# Patient Record
Sex: Male | Born: 1965 | Race: White | Hispanic: No | Marital: Married | Smoking: Never smoker
Health system: Southern US, Community
[De-identification: ages and names within clinical notes are randomized; demographics above are authoritative.]

## PROBLEM LIST (undated history)

## (undated) DIAGNOSIS — E785 Hyperlipidemia, unspecified: Secondary | ICD-10-CM

## (undated) DIAGNOSIS — I2510 Atherosclerotic heart disease of native coronary artery without angina pectoris: Secondary | ICD-10-CM

## (undated) DIAGNOSIS — W3400XA Accidental discharge from unspecified firearms or gun, initial encounter: Secondary | ICD-10-CM

## (undated) DIAGNOSIS — Z951 Presence of aortocoronary bypass graft: Secondary | ICD-10-CM

## (undated) DIAGNOSIS — G4733 Obstructive sleep apnea (adult) (pediatric): Secondary | ICD-10-CM

## (undated) DIAGNOSIS — Z9289 Personal history of other medical treatment: Secondary | ICD-10-CM

## (undated) DIAGNOSIS — I10 Essential (primary) hypertension: Secondary | ICD-10-CM

## (undated) DIAGNOSIS — M549 Dorsalgia, unspecified: Secondary | ICD-10-CM

## (undated) DIAGNOSIS — I219 Acute myocardial infarction, unspecified: Secondary | ICD-10-CM

## (undated) HISTORY — DX: Personal history of other medical treatment: Z92.89

## (undated) HISTORY — PX: ABDOMINAL SURGERY: SHX537

## (undated) HISTORY — DX: Hyperlipidemia, unspecified: E78.5

## (undated) HISTORY — PX: APPENDECTOMY: SHX54

## (undated) HISTORY — DX: Obstructive sleep apnea (adult) (pediatric): G47.33

## (undated) HISTORY — PX: FOOT SURGERY: SHX648

---

## 1999-09-21 ENCOUNTER — Emergency Department (HOSPITAL_COMMUNITY): Admission: EM | Admit: 1999-09-21 | Discharge: 1999-09-21 | Payer: Self-pay | Admitting: Emergency Medicine

## 2000-05-09 ENCOUNTER — Emergency Department (HOSPITAL_COMMUNITY): Admission: EM | Admit: 2000-05-09 | Discharge: 2000-05-09 | Payer: Self-pay | Admitting: Emergency Medicine

## 2005-03-29 ENCOUNTER — Emergency Department (HOSPITAL_COMMUNITY): Admission: EM | Admit: 2005-03-29 | Discharge: 2005-03-29 | Payer: Self-pay | Admitting: Emergency Medicine

## 2010-10-29 ENCOUNTER — Other Ambulatory Visit (HOSPITAL_COMMUNITY): Payer: Self-pay | Admitting: Podiatry

## 2010-10-29 DIAGNOSIS — M84374A Stress fracture, right foot, initial encounter for fracture: Secondary | ICD-10-CM

## 2010-11-06 ENCOUNTER — Encounter (HOSPITAL_COMMUNITY)
Admission: RE | Admit: 2010-11-06 | Discharge: 2010-11-06 | Disposition: A | Discharge status: Home / Self Care | Payer: BC Managed Care – PPO | Source: Ambulatory Visit | Attending: Podiatry | Admitting: Podiatry

## 2010-11-06 DIAGNOSIS — M8430XA Stress fracture, unspecified site, initial encounter for fracture: Secondary | ICD-10-CM | POA: Insufficient documentation

## 2010-11-06 DIAGNOSIS — M84374A Stress fracture, right foot, initial encounter for fracture: Secondary | ICD-10-CM

## 2010-11-06 DIAGNOSIS — X58XXXA Exposure to other specified factors, initial encounter: Secondary | ICD-10-CM | POA: Insufficient documentation

## 2010-11-06 MED ORDER — TECHNETIUM TC 99M MEDRONATE IV KIT
23.7000 | PACK | Freq: Once | INTRAVENOUS | Status: AC | PRN
  Administered 2010-11-06: 24 via INTRAVENOUS

## 2011-01-15 ENCOUNTER — Emergency Department (HOSPITAL_BASED_OUTPATIENT_CLINIC_OR_DEPARTMENT_OTHER)
Admission: EM | Admit: 2011-01-15 | Discharge: 2011-01-15 | Disposition: A | Discharge status: Home / Self Care | Payer: Self-pay | Attending: Emergency Medicine | Admitting: Emergency Medicine

## 2011-01-15 ENCOUNTER — Emergency Department (INDEPENDENT_AMBULATORY_CARE_PROVIDER_SITE_OTHER): Payer: Self-pay

## 2011-01-15 DIAGNOSIS — M25569 Pain in unspecified knee: Secondary | ICD-10-CM | POA: Insufficient documentation

## 2011-01-15 DIAGNOSIS — M25561 Pain in right knee: Secondary | ICD-10-CM

## 2011-01-15 DIAGNOSIS — M7989 Other specified soft tissue disorders: Secondary | ICD-10-CM

## 2011-01-15 DIAGNOSIS — M25469 Effusion, unspecified knee: Secondary | ICD-10-CM | POA: Insufficient documentation

## 2011-01-15 MED ORDER — IBUPROFEN 800 MG PO TABS: 800.0000 mg | ORAL_TABLET | Freq: Three times a day (TID) | ORAL | Status: AC

## 2011-01-15 MED ORDER — HYDROCODONE-ACETAMINOPHEN 5-325 MG PO TABS: 2.0000 | ORAL_TABLET | ORAL | Status: AC | PRN

## 2011-01-15 NOTE — ED Notes (Signed)
Pt. Walking to radiology with a slight limp

## 2011-01-15 NOTE — ED Notes (Signed)
Right knee pain x 1 week-denies injury

## 2011-01-15 NOTE — ED Notes (Signed)
Nurse had me apply ace wrap for patient comfort, then I applied immobilizer over ace per nurse.

## 2011-01-15 NOTE — ED Provider Notes (Signed)
Medical screening examination/treatment/procedure(s) were performed by non-physician practitioner and as supervising physician I was immediately available for consultation/collaboration.   Forbes Cellar, MD 01/15/11 [phone]

## 2011-01-15 NOTE — ED Provider Notes (Signed)
History     CSN: [id number] Arrival date & time: 01/15/2011 11:50 AM   First MD Initiated Contact with Patient 01/15/11 1256      Chief Complaint  Patient presents with  . Knee Pain    (Consider location/radiation/quality/duration/timing/severity/associated sxs/prior treatment) Patient is a 45 y.o. male presenting with knee pain. The history is provided by the patient. No language interpreter was used.  Knee Pain This is a new problem. The current episode started in the past 7 days. The problem occurs constantly. The problem has been gradually worsening. Associated symptoms include joint swelling and myalgias. Pertinent negatives include no fever. The symptoms are aggravated by exertion. He has tried rest for the symptoms. The treatment provided no relief.  Pt complains of pain and swelling in right knee.  Pt has a history of gout in the past in both big toes.  Pt is on medication for gout.   Past Medical History  Diagnosis Date  . Gout     Past Surgical History  Procedure Date  . Abdominal surgery     No family history on file.  History  Substance Use Topics  . Smoking status: Never Smoker   . Smokeless tobacco: Not on file  . Alcohol Use: Yes      Review of Systems  Constitutional: Negative for fever.  Musculoskeletal: Positive for myalgias and joint swelling.  All other systems reviewed and are negative.    Allergies  Codeine  Home Medications   Current Outpatient Rx  Name Route Sig Dispense Refill  . ULORIC PO Oral Take by mouth.        BP 143/90  Pulse 73  Temp(Src) 98.1 F (36.7 C) (Oral)  Resp 16  Ht 6\' 7"  (2.007 m)  Wt 290 lb (131.543 kg)  BMI 32.67 kg/m2  SpO2 100%  Physical Exam  Nursing note and vitals reviewed. Constitutional: He is oriented to person, place, and time. He appears well-developed and well-nourished.  HENT:  Head: Normocephalic and atraumatic.  Eyes: Conjunctivae and EOM are normal. Pupils are equal, round, and  reactive to light.  Musculoskeletal: He exhibits edema and tenderness.       Swollen tender right knee,  From with pain,  nv and ns intact,    Neurological: He is alert and oriented to person, place, and time.  Skin: Skin is warm and dry.    ED Course  Procedures (including critical care time)  Labs Reviewed - No data to display Dg Knee 1-2 Views Right  01/15/2011  *RADIOLOGY REPORT*  Clinical Data: Knee pain.  Swelling.  No known injury.  RIGHT KNEE - 1-2 VIEW  Comparison: None  Findings: Early degenerative changes in the right knee. No acute bony abnormality.  Specifically, no fracture, subluxation, or dislocation.  Soft tissues are intact.  No joint effusion.  IMPRESSION: No acute bony abnormality.  Original Report Authenticated By: Cyndie Chime, M.D.     No diagnosis found.    MDM  Pt denies fever, no uti or uri symptoms,  I doubt septic joint,  Pt may have gout,  Could be degenerative or cartiledge   Pt advised follow up with Dr. Pearletha Forge.  Ice and rest,  Knee imbolizer,  Rx for vicodin and  Ibuprofen.        Langston Masker, [place] 01/15/11 1340

## 2011-01-28 ENCOUNTER — Ambulatory Visit: Payer: BC Managed Care – PPO | Admitting: Family Medicine

## 2012-11-23 ENCOUNTER — Encounter (HOSPITAL_BASED_OUTPATIENT_CLINIC_OR_DEPARTMENT_OTHER): Payer: Self-pay | Admitting: Emergency Medicine

## 2012-11-23 ENCOUNTER — Emergency Department (HOSPITAL_BASED_OUTPATIENT_CLINIC_OR_DEPARTMENT_OTHER): Payer: BC Managed Care – PPO

## 2012-11-23 ENCOUNTER — Emergency Department (HOSPITAL_BASED_OUTPATIENT_CLINIC_OR_DEPARTMENT_OTHER)
Admission: EM | Admit: 2012-11-23 | Discharge: 2012-11-23 | Disposition: A | Discharge status: Home / Self Care | Payer: BC Managed Care – PPO | Attending: Emergency Medicine | Admitting: Emergency Medicine

## 2012-11-23 DIAGNOSIS — M109 Gout, unspecified: Secondary | ICD-10-CM | POA: Insufficient documentation

## 2012-11-23 DIAGNOSIS — Y92410 Unspecified street and highway as the place of occurrence of the external cause: Secondary | ICD-10-CM | POA: Insufficient documentation

## 2012-11-23 DIAGNOSIS — M549 Dorsalgia, unspecified: Secondary | ICD-10-CM

## 2012-11-23 DIAGNOSIS — IMO0002 Reserved for concepts with insufficient information to code with codable children: Secondary | ICD-10-CM | POA: Insufficient documentation

## 2012-11-23 DIAGNOSIS — Y9389 Activity, other specified: Secondary | ICD-10-CM | POA: Insufficient documentation

## 2012-11-23 MED ORDER — HYDROCODONE-ACETAMINOPHEN 5-325 MG PO TABS: 2.0000 | ORAL_TABLET | ORAL | Status: DC | PRN

## 2012-11-23 MED ORDER — MELOXICAM 7.5 MG PO TABS: 7.5000 mg | ORAL_TABLET | Freq: Every day | ORAL | Status: DC

## 2012-11-23 MED ORDER — METHOCARBAMOL 500 MG PO TABS: 500.0000 mg | ORAL_TABLET | Freq: Two times a day (BID) | ORAL | Status: DC
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
## 2012-11-23 NOTE — ED Notes (Signed)
Patient transported to X-ray 
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
## 2012-11-23 NOTE — ED Provider Notes (Signed)
Medical screening examination/treatment/procedure(s) were performed by non-physician practitioner and as supervising physician I was immediately available for consultation/collaboration.  EKG Interpretation   None         Dagmar Hait, MD 11/23/12 2243

## 2014-02-03 ENCOUNTER — Emergency Department (HOSPITAL_BASED_OUTPATIENT_CLINIC_OR_DEPARTMENT_OTHER)
Admission: EM | Admit: 2014-02-03 | Discharge: 2014-02-03 | Disposition: A | Discharge status: Home / Self Care | Payer: 59 | Attending: Emergency Medicine | Admitting: Emergency Medicine

## 2014-02-03 ENCOUNTER — Emergency Department (HOSPITAL_BASED_OUTPATIENT_CLINIC_OR_DEPARTMENT_OTHER): Payer: 59

## 2014-02-03 ENCOUNTER — Encounter (HOSPITAL_BASED_OUTPATIENT_CLINIC_OR_DEPARTMENT_OTHER): Payer: Self-pay | Admitting: *Deleted

## 2014-02-03 DIAGNOSIS — M109 Gout, unspecified: Secondary | ICD-10-CM | POA: Diagnosis not present

## 2014-02-03 DIAGNOSIS — R10816 Epigastric abdominal tenderness: Secondary | ICD-10-CM | POA: Insufficient documentation

## 2014-02-03 DIAGNOSIS — Z79899 Other long term (current) drug therapy: Secondary | ICD-10-CM | POA: Diagnosis not present

## 2014-02-03 DIAGNOSIS — R079 Chest pain, unspecified: Secondary | ICD-10-CM | POA: Insufficient documentation

## 2014-02-03 DIAGNOSIS — R112 Nausea with vomiting, unspecified: Secondary | ICD-10-CM | POA: Insufficient documentation

## 2014-02-03 DIAGNOSIS — I10 Essential (primary) hypertension: Secondary | ICD-10-CM | POA: Insufficient documentation

## 2014-02-03 HISTORY — DX: Accidental discharge from unspecified firearms or gun, initial encounter: W34.00XA

## 2014-02-03 HISTORY — DX: Essential (primary) hypertension: I10

## 2014-02-03 HISTORY — DX: Dorsalgia, unspecified: M54.9

## 2014-02-03 LAB — COMPREHENSIVE METABOLIC PANEL
ALT: 41 U/L (ref 0–53)
ANION GAP: 7 (ref 5–15)
AST: 39 U/L — ABNORMAL HIGH (ref 0–37)
Albumin: 4.5 g/dL (ref 3.5–5.2)
Alkaline Phosphatase: 71 U/L (ref 39–117)
BILIRUBIN TOTAL: 1 mg/dL (ref 0.3–1.2)
BUN: 17 mg/dL (ref 6–23)
CHLORIDE: 101 meq/L (ref 96–112)
CO2: 28 mmol/L (ref 19–32)
CREATININE: 0.92 mg/dL (ref 0.50–1.35)
Calcium: 9.1 mg/dL (ref 8.4–10.5)
GFR calc Af Amer: >90 mL/min (ref >90)
GFR calc non Af Amer: >90 mL/min (ref >90)
GLUCOSE: 102 mg/dL — AB (ref 70–99)
POTASSIUM: 4 mmol/L (ref 3.5–5.1)
SODIUM: 136 mmol/L (ref 135–145)
Total Protein: 7.3 g/dL (ref 6.0–8.3)

## 2014-02-03 LAB — CBC WITH DIFFERENTIAL/PLATELET
BASOS PCT: 0 % (ref 0–1)
Basophils Absolute: 0 10*3/uL (ref 0.0–0.1)
EOS ABS: 0.2 10*3/uL (ref 0.0–0.7)
EOS PCT: 2 % (ref 0–5)
HEMATOCRIT: 39.1 % (ref 39.0–52.0)
Hemoglobin: 13.9 g/dL (ref 13.0–17.0)
LYMPHS ABS: 2.1 10*3/uL (ref 0.7–4.0)
LYMPHS PCT: 24 % (ref 12–46)
MCH: 33.5 pg (ref 26.0–34.0)
MCHC: 35.5 g/dL (ref 30.0–36.0)
MCV: 94.2 fL (ref 78.0–100.0)
Monocytes Absolute: 0.6 10*3/uL (ref 0.1–1.0)
Monocytes Relative: 7 % (ref 3–12)
NEUTROS ABS: 5.8 10*3/uL (ref 1.7–7.7)
Neutrophils Relative %: 67 % (ref 43–77)
Platelets: 172 10*3/uL (ref 150–400)
RBC: 4.15 MIL/uL — AB (ref 4.22–5.81)
RDW: 11.3 % — ABNORMAL LOW (ref 11.5–15.5)
WBC: 8.7 10*3/uL (ref 4.0–10.5)

## 2014-02-03 LAB — TROPONIN I

## 2014-02-03 LAB — D-DIMER, QUANTITATIVE (NOT AT ARMC)

## 2014-02-03 LAB — LIPASE, BLOOD: Lipase: 37 U/L (ref 11–59)

## 2014-02-03 MED ORDER — ONDANSETRON HCL 4 MG PO TABS: 4.0000 mg | ORAL_TABLET | Freq: Four times a day (QID) | ORAL | Status: DC

## 2014-02-03 MED ORDER — ONDANSETRON HCL 4 MG/2ML IJ SOLN
4.0000 mg | Freq: Once | INTRAMUSCULAR | Status: AC
  Administered 2014-02-03: 4 mg via INTRAVENOUS
  Filled 2014-02-03: qty 2

## 2014-02-03 MED ORDER — SODIUM CHLORIDE 0.9 % IV BOLUS (SEPSIS)
1000.0000 mL | Freq: Once | INTRAVENOUS | Status: AC
  Administered 2014-02-03: 1000 mL via INTRAVENOUS

## 2014-02-03 MED ORDER — OMEPRAZOLE 20 MG PO CPDR: 20.0000 mg | DELAYED_RELEASE_CAPSULE | Freq: Every day | ORAL | Status: DC

## 2014-02-03 MED ORDER — MORPHINE SULFATE 4 MG/ML IJ SOLN
4.0000 mg | Freq: Once | INTRAMUSCULAR | Status: AC
  Administered 2014-02-03: 4 mg via INTRAVENOUS
  Filled 2014-02-03: qty 1

## 2014-02-03 NOTE — ED Notes (Addendum)
Sudden onset chest pain with n/v 3 hours pta; c/o left arm numb
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
## 2014-02-03 NOTE — ED Notes (Signed)
Patient requesting nausea medication. I will notify nurse.

## 2014-02-03 NOTE — Discharge Instructions (Signed)

## 2014-02-13 DIAGNOSIS — R0683 Snoring: Secondary | ICD-10-CM | POA: Insufficient documentation

## 2014-02-13 DIAGNOSIS — R0602 Shortness of breath: Secondary | ICD-10-CM | POA: Insufficient documentation

## 2014-02-13 DIAGNOSIS — G4710 Hypersomnia, unspecified: Secondary | ICD-10-CM | POA: Insufficient documentation

## 2014-03-01 ENCOUNTER — Inpatient Hospital Stay (HOSPITAL_BASED_OUTPATIENT_CLINIC_OR_DEPARTMENT_OTHER)
Admission: EM | Admit: 2014-03-01 | Discharge: 2014-03-07 | DRG: 231 | Disposition: A | Discharge status: Home / Self Care | Payer: 59 | Attending: Thoracic Surgery (Cardiothoracic Vascular Surgery) | Admitting: Thoracic Surgery (Cardiothoracic Vascular Surgery)

## 2014-03-01 ENCOUNTER — Encounter (HOSPITAL_BASED_OUTPATIENT_CLINIC_OR_DEPARTMENT_OTHER): Payer: Self-pay

## 2014-03-01 DIAGNOSIS — I25110 Atherosclerotic heart disease of native coronary artery with unstable angina pectoris: Secondary | ICD-10-CM | POA: Diagnosis not present

## 2014-03-01 DIAGNOSIS — D62 Acute posthemorrhagic anemia: Secondary | ICD-10-CM | POA: Diagnosis not present

## 2014-03-01 DIAGNOSIS — I213 ST elevation (STEMI) myocardial infarction of unspecified site: Secondary | ICD-10-CM | POA: Diagnosis not present

## 2014-03-01 DIAGNOSIS — I200 Unstable angina: Secondary | ICD-10-CM | POA: Diagnosis present

## 2014-03-01 DIAGNOSIS — R739 Hyperglycemia, unspecified: Secondary | ICD-10-CM | POA: Diagnosis present

## 2014-03-01 DIAGNOSIS — I2510 Atherosclerotic heart disease of native coronary artery without angina pectoris: Secondary | ICD-10-CM | POA: Diagnosis present

## 2014-03-01 DIAGNOSIS — R079 Chest pain, unspecified: Secondary | ICD-10-CM | POA: Diagnosis present

## 2014-03-01 DIAGNOSIS — J9811 Atelectasis: Secondary | ICD-10-CM

## 2014-03-01 DIAGNOSIS — D696 Thrombocytopenia, unspecified: Secondary | ICD-10-CM | POA: Diagnosis not present

## 2014-03-01 DIAGNOSIS — I10 Essential (primary) hypertension: Secondary | ICD-10-CM | POA: Diagnosis present

## 2014-03-01 DIAGNOSIS — Z8249 Family history of ischemic heart disease and other diseases of the circulatory system: Secondary | ICD-10-CM

## 2014-03-01 DIAGNOSIS — M109 Gout, unspecified: Secondary | ICD-10-CM | POA: Diagnosis present

## 2014-03-01 DIAGNOSIS — I2542 Coronary artery dissection: Secondary | ICD-10-CM | POA: Diagnosis not present

## 2014-03-01 DIAGNOSIS — E8770 Fluid overload, unspecified: Secondary | ICD-10-CM | POA: Diagnosis not present

## 2014-03-01 DIAGNOSIS — E785 Hyperlipidemia, unspecified: Secondary | ICD-10-CM | POA: Diagnosis present

## 2014-03-01 DIAGNOSIS — I219 Acute myocardial infarction, unspecified: Secondary | ICD-10-CM | POA: Diagnosis not present

## 2014-03-01 DIAGNOSIS — Z951 Presence of aortocoronary bypass graft: Secondary | ICD-10-CM

## 2014-03-01 HISTORY — PX: OTHER SURGICAL HISTORY: SHX169

## 2014-03-01 HISTORY — DX: Presence of aortocoronary bypass graft: Z95.1

## 2014-03-01 HISTORY — DX: Atherosclerotic heart disease of native coronary artery without angina pectoris: I25.10

## 2014-03-01 HISTORY — DX: Acute myocardial infarction, unspecified: I21.9

## 2014-03-01 LAB — BASIC METABOLIC PANEL
ANION GAP: 3 — AB (ref 5–15)
BUN: 17 mg/dL (ref 6–23)
CHLORIDE: 104 mmol/L (ref 96–112)
CO2: 27 mmol/L (ref 19–32)
Calcium: 9.2 mg/dL (ref 8.4–10.5)
Creatinine, Ser: 0.88 mg/dL (ref 0.50–1.35)
GFR calc non Af Amer: >90 mL/min (ref >90)
Glucose, Bld: 118 mg/dL — ABNORMAL HIGH (ref 70–99)
Potassium: 4.2 mmol/L (ref 3.5–5.1)
Sodium: 134 mmol/L — ABNORMAL LOW (ref 135–145)

## 2014-03-01 LAB — CBC WITH DIFFERENTIAL/PLATELET
Basophils Absolute: 0 10*3/uL (ref 0.0–0.1)
Basophils Relative: 0 % (ref 0–1)
Eosinophils Absolute: 0.1 10*3/uL (ref 0.0–0.7)
Eosinophils Relative: 2 % (ref 0–5)
HCT: 40.7 % (ref 39.0–52.0)
Hemoglobin: 14.1 g/dL (ref 13.0–17.0)
LYMPHS PCT: 18 % (ref 12–46)
Lymphs Abs: 1.3 10*3/uL (ref 0.7–4.0)
MCH: 32.8 pg (ref 26.0–34.0)
MCHC: 34.6 g/dL (ref 30.0–36.0)
MCV: 94.7 fL (ref 78.0–100.0)
MONO ABS: 0.5 10*3/uL (ref 0.1–1.0)
Monocytes Relative: 7 % (ref 3–12)
NEUTROS ABS: 5.5 10*3/uL (ref 1.7–7.7)
Neutrophils Relative %: 73 % (ref 43–77)
Platelets: 201 10*3/uL (ref 150–400)
RBC: 4.3 MIL/uL (ref 4.22–5.81)
RDW: 11.8 % (ref 11.5–15.5)
WBC: 7.4 10*3/uL (ref 4.0–10.5)

## 2014-03-01 LAB — TROPONIN I: Troponin I: 0.03 ng/mL (ref <0.031)

## 2014-03-01 MED ORDER — SODIUM CHLORIDE 0.9 % IJ SOLN
3.0000 mL | Freq: Two times a day (BID) | INTRAMUSCULAR | Status: DC
  Administered 2014-03-01 – 2014-03-02 (×2): 3 mL via INTRAVENOUS

## 2014-03-01 MED ORDER — MORPHINE SULFATE 15 MG PO TABS
30.0000 mg | ORAL_TABLET | Freq: Four times a day (QID) | ORAL | Status: DC | PRN
  Administered 2014-03-01 – 2014-03-02 (×3): 30 mg via ORAL
  Filled 2014-03-01 (×3): qty 2

## 2014-03-01 MED ORDER — SODIUM CHLORIDE 0.9 % IV SOLN
INTRAVENOUS | Status: DC
  Administered 2014-03-01: 22:00:00 via INTRAVENOUS
  Administered 2014-03-02: 1000 mL via INTRAVENOUS

## 2014-03-01 MED ORDER — PANTOPRAZOLE SODIUM 40 MG PO TBEC
40.0000 mg | DELAYED_RELEASE_TABLET | Freq: Every day | ORAL | Status: DC
  Administered 2014-03-01 – 2014-03-02 (×2): 40 mg via ORAL
  Filled 2014-03-01 (×2): qty 1

## 2014-03-01 MED ORDER — OXYCODONE HCL 5 MG PO TABS
5.0000 mg | ORAL_TABLET | ORAL | Status: DC | PRN
Start: 2014-03-01 — End: 2014-03-02

## 2014-03-01 MED ORDER — DOCUSATE SODIUM 100 MG PO CAPS
100.0000 mg | ORAL_CAPSULE | Freq: Every day | ORAL | Status: DC
  Administered 2014-03-01: 100 mg via ORAL
  Filled 2014-03-01: qty 1

## 2014-03-01 MED ORDER — FEBUXOSTAT 40 MG PO TABS
40.0000 mg | ORAL_TABLET | Freq: Every day | ORAL | Status: DC
  Filled 2014-03-01: qty 1

## 2014-03-01 MED ORDER — LISINOPRIL 5 MG PO TABS
5.0000 mg | ORAL_TABLET | Freq: Every day | ORAL | Status: DC
  Administered 2014-03-01 – 2014-03-02 (×2): 5 mg via ORAL
  Filled 2014-03-01 (×2): qty 1

## 2014-03-01 MED ORDER — POLYETHYLENE GLYCOL 3350 17 G PO PACK
17.0000 g | PACK | Freq: Every day | ORAL | Status: DC
  Administered 2014-03-01 – 2014-03-02 (×2): 17 g via ORAL
  Filled 2014-03-01 (×2): qty 1

## 2014-03-01 MED ORDER — NITROGLYCERIN 0.4 MG SL SUBL
0.4000 mg | SUBLINGUAL_TABLET | SUBLINGUAL | Status: AC | PRN
  Administered 2014-03-02 (×4): 0.4 mg via SUBLINGUAL
  Filled 2014-03-01 (×3): qty 1

## 2014-03-01 MED ORDER — ONDANSETRON HCL 4 MG PO TABS
4.0000 mg | ORAL_TABLET | Freq: Four times a day (QID) | ORAL | Status: DC
  Administered 2014-03-01 – 2014-03-02 (×3): 4 mg via ORAL
  Filled 2014-03-01 (×3): qty 1

## 2014-03-01 MED ORDER — OXYCODONE-ACETAMINOPHEN 10-325 MG PO TABS
1.0000 | ORAL_TABLET | ORAL | Status: DC | PRN
Start: 2014-03-01 — End: 2014-03-01

## 2014-03-01 MED ORDER — ENOXAPARIN SODIUM 40 MG/0.4ML ~~LOC~~ SOLN
40.0000 mg | SUBCUTANEOUS | Status: DC
  Administered 2014-03-01: 40 mg via SUBCUTANEOUS
  Filled 2014-03-01: qty 0.4

## 2014-03-01 MED ORDER — OXYCODONE-ACETAMINOPHEN 5-325 MG PO TABS
1.0000 | ORAL_TABLET | ORAL | Status: DC | PRN
  Administered 2014-03-02: 1 via ORAL
  Filled 2014-03-01: qty 1

## 2014-03-01 MED ORDER — ASPIRIN 81 MG PO CHEW
324.0000 mg | CHEWABLE_TABLET | Freq: Once | ORAL | Status: AC
  Administered 2014-03-01: 324 mg via ORAL
  Filled 2014-03-01: qty 4
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
## 2014-03-01 NOTE — ED Notes (Signed)
Chest pain that developed at 1300 while sitting at home.  Initially felt nauseated, then developed intermittent central chest pain. 6/10 at the worst and 3/10 now.  S?P Cardiac stress test this am at [facility] (unknown result).
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
## 2014-03-02 ENCOUNTER — Observation Stay (HOSPITAL_COMMUNITY): Payer: 59 | Admitting: Anesthesiology

## 2014-03-02 ENCOUNTER — Encounter (HOSPITAL_COMMUNITY): Payer: Self-pay | Admitting: Anesthesiology

## 2014-03-02 ENCOUNTER — Inpatient Hospital Stay (HOSPITAL_COMMUNITY): Payer: 59

## 2014-03-02 ENCOUNTER — Encounter (HOSPITAL_COMMUNITY)
Admission: EM | Disposition: A | Discharge status: Home / Self Care | Payer: 59 | Source: Home / Self Care | Attending: Thoracic Surgery (Cardiothoracic Vascular Surgery)

## 2014-03-02 ENCOUNTER — Encounter (HOSPITAL_COMMUNITY)
Admission: EM | Disposition: A | Discharge status: Home / Self Care | Payer: Self-pay | Source: Home / Self Care | Attending: Thoracic Surgery (Cardiothoracic Vascular Surgery)

## 2014-03-02 DIAGNOSIS — Z8249 Family history of ischemic heart disease and other diseases of the circulatory system: Secondary | ICD-10-CM

## 2014-03-02 DIAGNOSIS — I200 Unstable angina: Secondary | ICD-10-CM | POA: Diagnosis present

## 2014-03-02 DIAGNOSIS — I10 Essential (primary) hypertension: Secondary | ICD-10-CM | POA: Diagnosis present

## 2014-03-02 DIAGNOSIS — E785 Hyperlipidemia, unspecified: Secondary | ICD-10-CM | POA: Diagnosis present

## 2014-03-02 DIAGNOSIS — R072 Precordial pain: Secondary | ICD-10-CM

## 2014-03-02 DIAGNOSIS — I25110 Atherosclerotic heart disease of native coronary artery with unstable angina pectoris: Secondary | ICD-10-CM | POA: Diagnosis present

## 2014-03-02 DIAGNOSIS — R739 Hyperglycemia, unspecified: Secondary | ICD-10-CM | POA: Diagnosis present

## 2014-03-02 DIAGNOSIS — D696 Thrombocytopenia, unspecified: Secondary | ICD-10-CM | POA: Diagnosis not present

## 2014-03-02 DIAGNOSIS — I2542 Coronary artery dissection: Secondary | ICD-10-CM | POA: Diagnosis not present

## 2014-03-02 DIAGNOSIS — R7309 Other abnormal glucose: Secondary | ICD-10-CM

## 2014-03-02 DIAGNOSIS — E8770 Fluid overload, unspecified: Secondary | ICD-10-CM | POA: Diagnosis not present

## 2014-03-02 DIAGNOSIS — R079 Chest pain, unspecified: Secondary | ICD-10-CM

## 2014-03-02 DIAGNOSIS — M109 Gout, unspecified: Secondary | ICD-10-CM | POA: Diagnosis present

## 2014-03-02 DIAGNOSIS — D62 Acute posthemorrhagic anemia: Secondary | ICD-10-CM | POA: Diagnosis not present

## 2014-03-02 DIAGNOSIS — I219 Acute myocardial infarction, unspecified: Secondary | ICD-10-CM

## 2014-03-02 DIAGNOSIS — R0609 Other forms of dyspnea: Secondary | ICD-10-CM

## 2014-03-02 DIAGNOSIS — I2510 Atherosclerotic heart disease of native coronary artery without angina pectoris: Secondary | ICD-10-CM

## 2014-03-02 DIAGNOSIS — I213 ST elevation (STEMI) myocardial infarction of unspecified site: Secondary | ICD-10-CM | POA: Diagnosis not present

## 2014-03-02 DIAGNOSIS — Z951 Presence of aortocoronary bypass graft: Secondary | ICD-10-CM

## 2014-03-02 HISTORY — PX: CORONARY ARTERY BYPASS GRAFT: SHX141

## 2014-03-02 HISTORY — DX: Atherosclerotic heart disease of native coronary artery without angina pectoris: I25.10

## 2014-03-02 HISTORY — DX: Acute myocardial infarction, unspecified: I21.9

## 2014-03-02 HISTORY — PX: LEFT HEART CATHETERIZATION WITH CORONARY ANGIOGRAM: SHX5451

## 2014-03-02 HISTORY — DX: Presence of aortocoronary bypass graft: Z95.1

## 2014-03-02 LAB — POCT I-STAT, CHEM 8
BUN: 13 mg/dL (ref 6–23)
BUN: 12 mg/dL (ref 6–23)
BUN: 13 mg/dL (ref 6–23)
BUN: 11 mg/dL (ref 6–23)
CALCIUM ION: 1.23 mmol/L (ref 1.12–1.23)
CHLORIDE: 104 mmol/L (ref 96–112)
CHLORIDE: 104 mmol/L (ref 96–112)
CHLORIDE: 105 mmol/L (ref 96–112)
CREATININE: 0.7 mg/dL (ref 0.50–1.35)
CREATININE: 0.6 mg/dL (ref 0.50–1.35)
Calcium, Ion: 1.14 mmol/L (ref 1.12–1.23)
Calcium, Ion: 1.09 mmol/L — ABNORMAL LOW (ref 1.12–1.23)
Calcium, Ion: 1.14 mmol/L (ref 1.12–1.23)
Chloride: 104 mmol/L (ref 96–112)
Creatinine, Ser: 0.7 mg/dL (ref 0.50–1.35)
Creatinine, Ser: 0.8 mg/dL (ref 0.50–1.35)
GLUCOSE: 124 mg/dL — AB (ref 70–99)
Glucose, Bld: 148 mg/dL — ABNORMAL HIGH (ref 70–99)
Glucose, Bld: 115 mg/dL — ABNORMAL HIGH (ref 70–99)
Glucose, Bld: 129 mg/dL — ABNORMAL HIGH (ref 70–99)
HCT: 34 % — ABNORMAL LOW (ref 39.0–52.0)
HEMATOCRIT: 38 % — AB (ref 39.0–52.0)
HEMATOCRIT: 27 % — AB (ref 39.0–52.0)
HEMATOCRIT: 31 % — AB (ref 39.0–52.0)
HEMOGLOBIN: 11.6 g/dL — AB (ref 13.0–17.0)
HEMOGLOBIN: 10.5 g/dL — AB (ref 13.0–17.0)
Hemoglobin: 12.9 g/dL — ABNORMAL LOW (ref 13.0–17.0)
Hemoglobin: 9.2 g/dL — ABNORMAL LOW (ref 13.0–17.0)
POTASSIUM: 5.8 mmol/L — AB (ref 3.5–5.1)
POTASSIUM: 4.1 mmol/L (ref 3.5–5.1)
Potassium: 3.3 mmol/L — ABNORMAL LOW (ref 3.5–5.1)
Potassium: 3.7 mmol/L (ref 3.5–5.1)
SODIUM: 138 mmol/L (ref 135–145)
SODIUM: 139 mmol/L (ref 135–145)
Sodium: 140 mmol/L (ref 135–145)
Sodium: 138 mmol/L (ref 135–145)
TCO2: 19 mmol/L (ref 0–100)
TCO2: 21 mmol/L (ref 0–100)
TCO2: 21 mmol/L (ref 0–100)
TCO2: 21 mmol/L (ref 0–100)

## 2014-03-02 LAB — TROPONIN I
Troponin I: <0.03 ng/mL (ref <0.031)
Troponin I: <0.03 ng/mL (ref <0.031)

## 2014-03-02 LAB — PROTIME-INR
INR: 0.99 (ref 0.00–1.49)
INR: 1.47 (ref 0.00–1.49)
Prothrombin Time: 13.2 seconds (ref 11.6–15.2)
Prothrombin Time: 18 seconds — ABNORMAL HIGH (ref 11.6–15.2)

## 2014-03-02 LAB — CBC
HCT: 28.3 % — ABNORMAL LOW (ref 39.0–52.0)
HEMOGLOBIN: 10.1 g/dL — AB (ref 13.0–17.0)
MCH: 33.2 pg (ref 26.0–34.0)
MCHC: 35.7 g/dL (ref 30.0–36.0)
MCV: 93.1 fL (ref 78.0–100.0)
Platelets: 155 10*3/uL (ref 150–400)
RBC: 3.04 MIL/uL — AB (ref 4.22–5.81)
RDW: 12.4 % (ref 11.5–15.5)
WBC: 15 10*3/uL — AB (ref 4.0–10.5)

## 2014-03-02 LAB — POCT I-STAT 3, ART BLOOD GAS (G3+)
ACID-BASE DEFICIT: 3 mmol/L — AB (ref 0.0–2.0)
Acid-base deficit: 1 mmol/L (ref 0.0–2.0)
Acid-base deficit: 4 mmol/L — ABNORMAL HIGH (ref 0.0–2.0)
BICARBONATE: 24.5 meq/L — AB (ref 20.0–24.0)
BICARBONATE: 21.4 meq/L (ref 20.0–24.0)
Bicarbonate: 22 mEq/L (ref 20.0–24.0)
O2 SAT: 100 %
O2 SAT: 99 %
O2 Saturation: 100 %
PCO2 ART: 37.4 mmHg (ref 35.0–45.0)
PO2 ART: 161 mmHg — AB (ref 80.0–100.0)
TCO2: 23 mmol/L (ref 0–100)
TCO2: 26 mmol/L (ref 0–100)
TCO2: 23 mmol/L (ref 0–100)
pCO2 arterial: 36.8 mmHg (ref 35.0–45.0)
pCO2 arterial: 45.3 mmHg — ABNORMAL HIGH (ref 35.0–45.0)
pH, Arterial: 7.341 — ABNORMAL LOW (ref 7.350–7.450)
pH, Arterial: 7.384 (ref 7.350–7.450)
pH, Arterial: 7.359 (ref 7.350–7.450)
pO2, Arterial: 280 mmHg — ABNORMAL HIGH (ref 80.0–100.0)
pO2, Arterial: 374 mmHg — ABNORMAL HIGH (ref 80.0–100.0)

## 2014-03-02 LAB — APTT: APTT: 42 s — AB (ref 24–37)

## 2014-03-02 LAB — POCT ACTIVATED CLOTTING TIME: ACTIVATED CLOTTING TIME: 540 s

## 2014-03-02 LAB — HEMOGLOBIN AND HEMATOCRIT, BLOOD
HCT: 25.3 % — ABNORMAL LOW (ref 39.0–52.0)
Hemoglobin: 9.1 g/dL — ABNORMAL LOW (ref 13.0–17.0)

## 2014-03-02 LAB — POCT I-STAT 4, (NA,K, GLUC, HGB,HCT)
Glucose, Bld: 112 mg/dL — ABNORMAL HIGH (ref 70–99)
HEMATOCRIT: 30 % — AB (ref 39.0–52.0)
Hemoglobin: 10.2 g/dL — ABNORMAL LOW (ref 13.0–17.0)
Potassium: 4.2 mmol/L (ref 3.5–5.1)
Sodium: 139 mmol/L (ref 135–145)

## 2014-03-02 LAB — PREPARE RBC (CROSSMATCH)

## 2014-03-02 LAB — ABO/RH: ABO/RH(D): O POS

## 2014-03-02 LAB — PLATELET COUNT: PLATELETS: 169 10*3/uL (ref 150–400)

## 2014-03-02 LAB — MRSA PCR SCREENING: MRSA BY PCR: NEGATIVE

## 2014-03-02 SURGERY — CORONARY ARTERY BYPASS GRAFTING (CABG)
Anesthesia: General | Site: Chest

## 2014-03-02 SURGERY — LEFT HEART CATHETERIZATION WITH CORONARY ANGIOGRAM
Anesthesia: LOCAL

## 2014-03-02 MED ORDER — NITROGLYCERIN 0.4 MG SL SUBL
0.4000 mg | SUBLINGUAL_TABLET | SUBLINGUAL | Status: DC | PRN
Start: 2014-03-02 — End: 2014-03-02
  Filled 2014-03-02: qty 1

## 2014-03-02 MED ORDER — MIDAZOLAM HCL 10 MG/2ML IJ SOLN
INTRAMUSCULAR | Status: AC
  Filled 2014-03-02: qty 2

## 2014-03-02 MED ORDER — HEPARIN SODIUM (PORCINE) 1000 UNIT/ML IJ SOLN
INTRAMUSCULAR | Status: DC | PRN
  Administered 2014-03-02: 35000 [IU] via INTRAVENOUS

## 2014-03-02 MED ORDER — MORPHINE SULFATE 2 MG/ML IJ SOLN
1.0000 mg | INTRAMUSCULAR | Status: DC | PRN
Start: 2014-03-02 — End: 2014-03-02
  Administered 2014-03-02: 1 mg via INTRAVENOUS
  Filled 2014-03-02: qty 1

## 2014-03-02 MED ORDER — NITROGLYCERIN IN D5W 200-5 MCG/ML-% IV SOLN: 0.0000 ug/min | INTRAVENOUS | Status: DC

## 2014-03-02 MED ORDER — FENTANYL CITRATE 0.05 MG/ML IJ SOLN
INTRAMUSCULAR | Status: DC | PRN
  Administered 2014-03-02: 500 ug via INTRAVENOUS
  Administered 2014-03-02: 350 ug via INTRAVENOUS
  Administered 2014-03-02: 100 ug via INTRAVENOUS
  Administered 2014-03-02 (×2): 150 ug via INTRAVENOUS

## 2014-03-02 MED ORDER — PAPAVERINE HCL 30 MG/ML IJ SOLN
INTRAMUSCULAR | Status: DC | PRN
  Administered 2014-03-02: 500 mL via INTRAVASCULAR

## 2014-03-02 MED ORDER — FENTANYL CITRATE 0.05 MG/ML IJ SOLN
INTRAMUSCULAR | Status: AC
  Filled 2014-03-02: qty 2

## 2014-03-02 MED ORDER — AMINOCAPROIC ACID 250 MG/ML IV SOLN: INTRAVENOUS | Status: DC | PRN

## 2014-03-02 MED ORDER — SODIUM CHLORIDE 0.9 % IV SOLN
INTRAVENOUS | Status: DC
  Administered 2014-03-02: 100 mL/h via INTRAVENOUS

## 2014-03-02 MED ORDER — METOPROLOL TARTRATE 25 MG/10 ML ORAL SUSPENSION
12.5000 mg | Freq: Two times a day (BID) | ORAL | Status: DC
  Filled 2014-03-02 (×3): qty 5

## 2014-03-02 MED ORDER — PHENYLEPHRINE HCL 10 MG/ML IJ SOLN
30.0000 ug/min | INTRAVENOUS | Status: DC
  Filled 2014-03-02: qty 2

## 2014-03-02 MED ORDER — ARTIFICIAL TEARS OP OINT
TOPICAL_OINTMENT | OPHTHALMIC | Status: DC | PRN
  Administered 2014-03-02: 1 via OPHTHALMIC

## 2014-03-02 MED ORDER — SODIUM CHLORIDE 0.9 % IJ SOLN: 3.0000 mL | Freq: Two times a day (BID) | INTRAMUSCULAR | Status: DC

## 2014-03-02 MED ORDER — LACTATED RINGERS IV SOLN
INTRAVENOUS | Status: DC | PRN
  Administered 2014-03-02 (×2): via INTRAVENOUS

## 2014-03-02 MED ORDER — NITROGLYCERIN IN D5W 200-5 MCG/ML-% IV SOLN
2.0000 ug/min | INTRAVENOUS | Status: DC
  Filled 2014-03-02: qty 250

## 2014-03-02 MED ORDER — SODIUM CHLORIDE 0.9 % IV SOLN: 250.0000 mL | INTRAVENOUS | Status: DC | PRN

## 2014-03-02 MED ORDER — ACETAMINOPHEN 160 MG/5ML PO SOLN
650.0000 mg | Freq: Once | ORAL | Status: AC
Start: 2014-03-02 — End: 2014-03-02
  Administered 2014-03-02: 650 mg

## 2014-03-02 MED ORDER — DEXMEDETOMIDINE HCL IN NACL 200 MCG/50ML IV SOLN
INTRAVENOUS | Status: DC | PRN
  Administered 2014-03-02: 0.3 ug/kg/h via INTRAVENOUS

## 2014-03-02 MED ORDER — FENTANYL CITRATE 0.05 MG/ML IJ SOLN
INTRAMUSCULAR | Status: AC
  Filled 2014-03-02: qty 5

## 2014-03-02 MED ORDER — SODIUM CHLORIDE 0.9 % IJ SOLN
3.0000 mL | INTRAMUSCULAR | Status: DC | PRN
Start: 2014-03-02 — End: 2014-03-02

## 2014-03-02 MED ORDER — PROTAMINE SULFATE 10 MG/ML IV SOLN
INTRAVENOUS | Status: AC
  Filled 2014-03-02: qty 25

## 2014-03-02 MED ORDER — INSULIN REGULAR BOLUS VIA INFUSION
0.0000 [IU] | Freq: Three times a day (TID) | INTRAVENOUS | Status: DC
  Filled 2014-03-02: qty 10

## 2014-03-02 MED ORDER — MIDAZOLAM HCL 2 MG/2ML IJ SOLN
2.0000 mg | INTRAMUSCULAR | Status: DC | PRN
  Administered 2014-03-02 – 2014-03-03 (×3): 2 mg via INTRAVENOUS
  Filled 2014-03-02 (×3): qty 2

## 2014-03-02 MED ORDER — PROTAMINE SULFATE 10 MG/ML IV SOLN
INTRAVENOUS | Status: AC
  Filled 2014-03-02: qty 5

## 2014-03-02 MED ORDER — SODIUM CHLORIDE 0.9 % IV SOLN
INTRAVENOUS | Status: DC
  Filled 2014-03-02: qty 40

## 2014-03-02 MED ORDER — SODIUM CHLORIDE 0.9 % IV SOLN
INTRAVENOUS | Status: DC
  Filled 2014-03-02: qty 2.5

## 2014-03-02 MED ORDER — OXYCODONE HCL 5 MG PO TABS
5.0000 mg | ORAL_TABLET | ORAL | Status: DC | PRN
  Administered 2014-03-03 – 2014-03-04 (×8): 10 mg via ORAL
  Administered 2014-03-04: 5 mg via ORAL
  Administered 2014-03-05 – 2014-03-07 (×18): 10 mg via ORAL
  Filled 2014-03-02 (×27): qty 2

## 2014-03-02 MED ORDER — SODIUM CHLORIDE 0.45 % IV SOLN
INTRAVENOUS | Status: DC
  Administered 2014-03-02: 20 mL/h via INTRAVENOUS

## 2014-03-02 MED ORDER — DEXTROSE 5 % IV SOLN
0.0000 ug/min | INTRAVENOUS | Status: DC
  Administered 2014-03-03: 12 ug/min via INTRAVENOUS
  Filled 2014-03-02 (×2): qty 2

## 2014-03-02 MED ORDER — BIVALIRUDIN 250 MG IV SOLR
0.2500 mg/kg/h | INTRAVENOUS | Status: DC
  Filled 2014-03-02: qty 250

## 2014-03-02 MED ORDER — DEXTROSE 5 % IV SOLN
1.5000 g | Freq: Two times a day (BID) | INTRAVENOUS | Status: AC
  Administered 2014-03-03 – 2014-03-04 (×4): 1.5 g via INTRAVENOUS
  Filled 2014-03-02 (×4): qty 1.5

## 2014-03-02 MED ORDER — NITROGLYCERIN 1 MG/10 ML FOR IR/CATH LAB
INTRA_ARTERIAL | Status: AC
  Filled 2014-03-02: qty 10

## 2014-03-02 MED ORDER — PHENYLEPHRINE HCL 10 MG/ML IJ SOLN
INTRAMUSCULAR | Status: DC | PRN
  Administered 2014-03-02 (×3): 80 ug via INTRAVENOUS

## 2014-03-02 MED ORDER — ONDANSETRON HCL 4 MG/2ML IJ SOLN
4.0000 mg | Freq: Four times a day (QID) | INTRAMUSCULAR | Status: DC | PRN
  Administered 2014-03-03 – 2014-03-05 (×3): 4 mg via INTRAVENOUS
  Filled 2014-03-02 (×3): qty 2

## 2014-03-02 MED ORDER — MORPHINE SULFATE 2 MG/ML IJ SOLN: 2.0000 mg | INTRAMUSCULAR | Status: DC | PRN

## 2014-03-02 MED ORDER — SODIUM CHLORIDE 0.9 % IV SOLN: 250.0000 mL | INTRAVENOUS | Status: DC

## 2014-03-02 MED ORDER — MIDAZOLAM HCL 2 MG/2ML IJ SOLN
INTRAMUSCULAR | Status: AC
  Administered 2014-03-02: 2 mg via INTRAVENOUS
  Filled 2014-03-02: qty 2

## 2014-03-02 MED ORDER — TRAMADOL HCL 50 MG PO TABS
50.0000 mg | ORAL_TABLET | ORAL | Status: DC | PRN
  Administered 2014-03-03 – 2014-03-05 (×2): 100 mg via ORAL
  Filled 2014-03-02 (×2): qty 2

## 2014-03-02 MED ORDER — VANCOMYCIN HCL 1000 MG IV SOLR
INTRAVENOUS | Status: DC
  Filled 2014-03-02: qty 1000

## 2014-03-02 MED ORDER — POTASSIUM CHLORIDE 10 MEQ/50ML IV SOLN: 10.0000 meq | INTRAVENOUS | Status: DC

## 2014-03-02 MED ORDER — HEPARIN (PORCINE) IN NACL 2-0.9 UNIT/ML-% IJ SOLN
INTRAMUSCULAR | Status: AC
Start: 2014-03-02 — End: 2014-03-02
  Filled 2014-03-02: qty 1500

## 2014-03-02 MED ORDER — SODIUM CHLORIDE 0.9 % IV SOLN: INTRAVENOUS | Status: DC

## 2014-03-02 MED ORDER — SODIUM CHLORIDE 0.9 % IV SOLN
10.0000 g | INTRAVENOUS | Status: DC | PRN
  Administered 2014-03-02: 5 g/h via INTRAVENOUS

## 2014-03-02 MED ORDER — LACTATED RINGERS IV SOLN
INTRAVENOUS | Status: DC
  Administered 2014-03-02 (×2): 20 mL/h via INTRAVENOUS

## 2014-03-02 MED ORDER — 0.9 % SODIUM CHLORIDE (POUR BTL) OPTIME
TOPICAL | Status: DC | PRN
  Administered 2014-03-02: 6000 mL

## 2014-03-02 MED ORDER — SODIUM CHLORIDE 0.9 % IV SOLN
Freq: Once | INTRAVENOUS | Status: AC
  Administered 2014-03-02: 1000 mL via INTRAVENOUS

## 2014-03-02 MED ORDER — ACETAMINOPHEN 325 MG PO TABS
650.0000 mg | ORAL_TABLET | Freq: Four times a day (QID) | ORAL | Status: DC | PRN
  Administered 2014-03-02: 650 mg via ORAL
  Filled 2014-03-02: qty 2

## 2014-03-02 MED ORDER — ACETAMINOPHEN 160 MG/5ML PO SOLN
1000.0000 mg | Freq: Four times a day (QID) | ORAL | Status: DC
  Administered 2014-03-03: 1000 mg
  Filled 2014-03-02: qty 40.6

## 2014-03-02 MED ORDER — ENOXAPARIN SODIUM 80 MG/0.8ML ~~LOC~~ SOLN: 70.0000 mg | SUBCUTANEOUS | Status: DC

## 2014-03-02 MED ORDER — MIDAZOLAM HCL 5 MG/5ML IJ SOLN
INTRAMUSCULAR | Status: DC | PRN
  Administered 2014-03-02 (×2): 3 mg via INTRAVENOUS
  Administered 2014-03-02: 4 mg via INTRAVENOUS

## 2014-03-02 MED ORDER — DEXMEDETOMIDINE HCL IN NACL 400 MCG/100ML IV SOLN
0.4000 ug/kg/h | INTRAVENOUS | Status: DC
  Administered 2014-03-02: 0.7 ug/kg/h via INTRAVENOUS
  Administered 2014-03-03: 0.3 ug/kg/h via INTRAVENOUS
  Filled 2014-03-02 (×2): qty 100

## 2014-03-02 MED ORDER — EPINEPHRINE HCL 1 MG/ML IJ SOLN
0.0000 ug/min | INTRAVENOUS | Status: DC
  Filled 2014-03-02: qty 4

## 2014-03-02 MED ORDER — DEXTROSE 5 % IV SOLN: 1.5000 g | INTRAVENOUS | Status: DC

## 2014-03-02 MED ORDER — BISACODYL 5 MG PO TBEC
10.0000 mg | DELAYED_RELEASE_TABLET | Freq: Every day | ORAL | Status: DC
  Administered 2014-03-03 – 2014-03-04 (×2): 10 mg via ORAL
  Filled 2014-03-02 (×2): qty 2

## 2014-03-02 MED ORDER — HEPARIN SODIUM (PORCINE) 1000 UNIT/ML IJ SOLN
INTRAMUSCULAR | Status: AC
  Filled 2014-03-02: qty 1

## 2014-03-02 MED ORDER — SODIUM CHLORIDE 0.9 % IV SOLN
Freq: Once | INTRAVENOUS | Status: AC
  Administered 2014-03-02: 10 mL/h via INTRAVENOUS

## 2014-03-02 MED ORDER — PROPOFOL 10 MG/ML IV BOLUS
INTRAVENOUS | Status: DC | PRN
  Administered 2014-03-02: 110 mg via INTRAVENOUS

## 2014-03-02 MED ORDER — SODIUM CHLORIDE 0.9 % IV SOLN
INTRAVENOUS | Status: DC
  Filled 2014-03-02: qty 30

## 2014-03-02 MED ORDER — FAMOTIDINE IN NACL 20-0.9 MG/50ML-% IV SOLN
20.0000 mg | Freq: Two times a day (BID) | INTRAVENOUS | Status: AC
  Administered 2014-03-02 – 2014-03-03 (×2): 20 mg via INTRAVENOUS
  Filled 2014-03-02: qty 50

## 2014-03-02 MED ORDER — MAGNESIUM SULFATE 50 % IJ SOLN
40.0000 meq | INTRAMUSCULAR | Status: DC
  Filled 2014-03-02: qty 10

## 2014-03-02 MED ORDER — VANCOMYCIN HCL 1000 MG IV SOLR
INTRAVENOUS | Status: DC | PRN
  Administered 2014-03-02: 1000 mL

## 2014-03-02 MED ORDER — ACETAMINOPHEN 500 MG PO TABS
1000.0000 mg | ORAL_TABLET | Freq: Four times a day (QID) | ORAL | Status: DC
  Administered 2014-03-03 – 2014-03-07 (×17): 1000 mg via ORAL
  Filled 2014-03-02 (×24): qty 2

## 2014-03-02 MED ORDER — MORPHINE SULFATE 2 MG/ML IJ SOLN
2.0000 mg | Freq: Once | INTRAMUSCULAR | Status: AC
  Administered 2014-03-02: 2 mg via INTRAVENOUS
  Filled 2014-03-02: qty 1

## 2014-03-02 MED ORDER — LACTATED RINGERS IV SOLN: 500.0000 mL | Freq: Once | INTRAVENOUS | Status: AC | PRN

## 2014-03-02 MED ORDER — BIVALIRUDIN 250 MG IV SOLR
INTRAVENOUS | Status: AC
  Filled 2014-03-02: qty 250

## 2014-03-02 MED ORDER — SODIUM CHLORIDE 0.9 % IJ SOLN
OROMUCOSAL | Status: DC | PRN
  Administered 2014-03-02 (×7): 4 mL via TOPICAL

## 2014-03-02 MED ORDER — MAGNESIUM SULFATE 4 GM/100ML IV SOLN
INTRAVENOUS | Status: AC
  Administered 2014-03-02: 4 g via INTRAVENOUS
  Filled 2014-03-02: qty 100

## 2014-03-02 MED ORDER — LACTATED RINGERS IV SOLN: INTRAVENOUS | Status: DC | PRN

## 2014-03-02 MED ORDER — SODIUM CHLORIDE 0.9 % IJ SOLN
3.0000 mL | Freq: Two times a day (BID) | INTRAMUSCULAR | Status: DC
  Administered 2014-03-03 – 2014-03-06 (×7): 3 mL via INTRAVENOUS

## 2014-03-02 MED ORDER — DEXMEDETOMIDINE HCL IN NACL 400 MCG/100ML IV SOLN
0.1000 ug/kg/h | INTRAVENOUS | Status: DC
  Filled 2014-03-02: qty 100

## 2014-03-02 MED ORDER — NITROGLYCERIN IN D5W 200-5 MCG/ML-% IV SOLN
INTRAVENOUS | Status: DC | PRN
  Administered 2014-03-02: 5 ug/min via INTRAVENOUS

## 2014-03-02 MED ORDER — PANTOPRAZOLE SODIUM 40 MG PO TBEC
40.0000 mg | DELAYED_RELEASE_TABLET | Freq: Every day | ORAL | Status: DC
  Administered 2014-03-04 – 2014-03-07 (×4): 40 mg via ORAL
  Filled 2014-03-02 (×4): qty 1

## 2014-03-02 MED ORDER — ACETAMINOPHEN 650 MG RE SUPP: 650.0000 mg | Freq: Once | RECTAL | Status: AC

## 2014-03-02 MED ORDER — DEXTROSE 5 % IV SOLN
750.0000 mg | INTRAVENOUS | Status: DC
  Filled 2014-03-02: qty 750

## 2014-03-02 MED ORDER — CALCIUM CHLORIDE 10 % IV SOLN
INTRAVENOUS | Status: DC | PRN
  Administered 2014-03-02: 1 g via INTRAVENOUS

## 2014-03-02 MED ORDER — PROTAMINE SULFATE 10 MG/ML IV SOLN
INTRAVENOUS | Status: DC | PRN
  Administered 2014-03-02: 330 mg via INTRAVENOUS

## 2014-03-02 MED ORDER — MORPHINE SULFATE 2 MG/ML IJ SOLN
1.0000 mg | INTRAMUSCULAR | Status: DC | PRN
  Administered 2014-03-02 – 2014-03-03 (×6): 2 mg via INTRAVENOUS
  Filled 2014-03-02: qty 2
  Filled 2014-03-02 (×5): qty 1

## 2014-03-02 MED ORDER — ASPIRIN EC 325 MG PO TBEC
325.0000 mg | DELAYED_RELEASE_TABLET | Freq: Every day | ORAL | Status: DC
  Administered 2014-03-03 – 2014-03-07 (×5): 325 mg via ORAL
  Filled 2014-03-02 (×5): qty 1

## 2014-03-02 MED ORDER — ALBUMIN HUMAN 5 % IV SOLN
250.0000 mL | INTRAVENOUS | Status: AC | PRN
  Administered 2014-03-02 – 2014-03-03 (×4): 250 mL via INTRAVENOUS
  Filled 2014-03-02 (×2): qty 250

## 2014-03-02 MED ORDER — ASPIRIN 81 MG PO CHEW
81.0000 mg | CHEWABLE_TABLET | ORAL | Status: AC
  Administered 2014-03-02: 81 mg via ORAL
  Filled 2014-03-02: qty 1

## 2014-03-02 MED ORDER — ALBUMIN HUMAN 5 % IV SOLN
INTRAVENOUS | Status: DC | PRN
  Administered 2014-03-02: 20:00:00 via INTRAVENOUS

## 2014-03-02 MED ORDER — VANCOMYCIN HCL IN DEXTROSE 1-5 GM/200ML-% IV SOLN
1000.0000 mg | Freq: Once | INTRAVENOUS | Status: AC
  Administered 2014-03-03: 1000 mg via INTRAVENOUS
  Filled 2014-03-02: qty 200

## 2014-03-02 MED ORDER — DEXTROSE 5 % IV SOLN
10.0000 mg | INTRAVENOUS | Status: DC | PRN
  Administered 2014-03-02: 10 ug/min via INTRAVENOUS

## 2014-03-02 MED ORDER — MAGNESIUM SULFATE 4 GM/100ML IV SOLN
4.0000 g | Freq: Once | INTRAVENOUS | Status: AC
  Administered 2014-03-02: 4 g via INTRAVENOUS

## 2014-03-02 MED ORDER — DEXTROSE 5 % IV SOLN
1.5000 g | INTRAVENOUS | Status: DC | PRN
  Administered 2014-03-02: .75 g via INTRAVENOUS
  Administered 2014-03-02: 1.5 g via INTRAVENOUS

## 2014-03-02 MED ORDER — LIDOCAINE HCL (PF) 1 % IJ SOLN
INTRAMUSCULAR | Status: AC
  Filled 2014-03-02: qty 30

## 2014-03-02 MED ORDER — VECURONIUM BROMIDE 10 MG IV SOLR
INTRAVENOUS | Status: DC | PRN
  Administered 2014-03-02: 10 mg via INTRAVENOUS

## 2014-03-02 MED ORDER — METOPROLOL TARTRATE 1 MG/ML IV SOLN: 2.5000 mg | INTRAVENOUS | Status: DC | PRN

## 2014-03-02 MED ORDER — CALCIUM CHLORIDE 10 % IV SOLN
INTRAVENOUS | Status: AC
  Filled 2014-03-02: qty 10

## 2014-03-02 MED ORDER — PLASMA-LYTE 148 IV SOLN
INTRAVENOUS | Status: DC
  Filled 2014-03-02: qty 2.5

## 2014-03-02 MED ORDER — LIDOCAINE HCL (CARDIAC) 20 MG/ML IV SOLN
INTRAVENOUS | Status: DC | PRN
  Administered 2014-03-02: 100 mg via INTRAVENOUS

## 2014-03-02 MED ORDER — POTASSIUM CHLORIDE 2 MEQ/ML IV SOLN
80.0000 meq | INTRAVENOUS | Status: DC
  Filled 2014-03-02: qty 40

## 2014-03-02 MED ORDER — MIDAZOLAM HCL 2 MG/2ML IJ SOLN
INTRAMUSCULAR | Status: AC
  Filled 2014-03-02: qty 2

## 2014-03-02 MED ORDER — DOPAMINE-DEXTROSE 3.2-5 MG/ML-% IV SOLN
0.0000 ug/kg/min | INTRAVENOUS | Status: DC
  Filled 2014-03-02: qty 250

## 2014-03-02 MED ORDER — VANCOMYCIN HCL 10 G IV SOLR
1500.0000 mg | INTRAVENOUS | Status: AC
  Administered 2014-03-02: 1500 mg via INTRAVENOUS
  Filled 2014-03-02: qty 1500

## 2014-03-02 MED ORDER — DEXTROSE 5 % IV SOLN
1.5000 g | INTRAVENOUS | Status: DC
  Filled 2014-03-02: qty 1.5

## 2014-03-02 MED ORDER — ROCURONIUM BROMIDE 100 MG/10ML IV SOLN
INTRAVENOUS | Status: DC | PRN
  Administered 2014-03-02: 100 mg via INTRAVENOUS
  Administered 2014-03-02: 50 mg via INTRAVENOUS

## 2014-03-02 MED ORDER — DEXMEDETOMIDINE HCL IN NACL 200 MCG/50ML IV SOLN
0.0000 ug/kg/h | INTRAVENOUS | Status: DC
  Filled 2014-03-02: qty 50

## 2014-03-02 MED ORDER — DOCUSATE SODIUM 100 MG PO CAPS
200.0000 mg | ORAL_CAPSULE | Freq: Every day | ORAL | Status: DC
  Administered 2014-03-03 – 2014-03-05 (×3): 200 mg via ORAL
  Filled 2014-03-02 (×4): qty 2

## 2014-03-02 MED ORDER — BISACODYL 10 MG RE SUPP: 10.0000 mg | Freq: Every day | RECTAL | Status: DC

## 2014-03-02 MED ORDER — METOPROLOL TARTRATE 12.5 MG HALF TABLET
12.5000 mg | ORAL_TABLET | Freq: Two times a day (BID) | ORAL | Status: DC
  Administered 2014-03-03 – 2014-03-05 (×4): 12.5 mg via ORAL
  Filled 2014-03-02 (×9): qty 1

## 2014-03-02 MED ORDER — VERAPAMIL HCL 2.5 MG/ML IV SOLN
INTRAVENOUS | Status: AC
  Filled 2014-03-02: qty 2

## 2014-03-02 MED ORDER — PHENYLEPHRINE HCL 10 MG/ML IJ SOLN: 10.0000 mg | INTRAMUSCULAR | Status: DC | PRN

## 2014-03-02 MED ORDER — TICAGRELOR 90 MG PO TABS
ORAL_TABLET | ORAL | Status: AC
  Filled 2014-03-02: qty 2

## 2014-03-02 MED ORDER — SODIUM CHLORIDE 0.9 % IJ SOLN: 3.0000 mL | INTRAMUSCULAR | Status: DC | PRN

## 2014-03-02 MED ORDER — SODIUM CHLORIDE 0.9 % IV SOLN
250.0000 [IU] | INTRAVENOUS | Status: DC | PRN
  Administered 2014-03-02: 2.6 [IU]/h via INTRAVENOUS

## 2014-03-02 MED ORDER — ASPIRIN 81 MG PO CHEW: 324.0000 mg | CHEWABLE_TABLET | Freq: Every day | ORAL | Status: DC

## 2014-03-02 SURGICAL SUPPLY — 122 items
ADAPTER CARDIO PERF ANTE/RETRO (ADAPTER) ×1 IMPLANT
ADPR PRFSN 84XANTGRD RTRGD (ADAPTER) ×1
APL SKNCLS STERI-STRIP NONHPOA (GAUZE/BANDAGES/DRESSINGS) ×2
APPLIER CLIP 9.375 MED OPEN (MISCELLANEOUS)
APPLIER CLIP 9.375 SM OPEN (CLIP)
APR CLP MED 9.3 20 MLT OPN (MISCELLANEOUS)
APR CLP SM 9.3 20 MLT OPN (CLIP)
ATTRACTOMAT 16X20 MAGNETIC DRP (DRAPES) ×2 IMPLANT
BAG DECANTER FOR FLEXI CONT (MISCELLANEOUS) ×3 IMPLANT
BANDAGE ELASTIC 4 VELCRO ST LF (GAUZE/BANDAGES/DRESSINGS) ×2 IMPLANT
BANDAGE ELASTIC 6 VELCRO ST LF (GAUZE/BANDAGES/DRESSINGS) ×2 IMPLANT
BASKET HEART (ORDER IN 25'S) (MISCELLANEOUS) ×1
BASKET HEART (ORDER IN 25S) (MISCELLANEOUS) ×1 IMPLANT
BENZOIN TINCTURE PRP APPL 2/3 (GAUZE/BANDAGES/DRESSINGS) ×3 IMPLANT
BLADE STERNUM SYSTEM 6 (BLADE) ×2 IMPLANT
BLADE SURG 11 STRL SS (BLADE) ×1 IMPLANT
BLADE SURG ROTATE 9660 (MISCELLANEOUS) ×1 IMPLANT
BNDG GAUZE ELAST 4 BULKY (GAUZE/BANDAGES/DRESSINGS) ×2 IMPLANT
CANISTER SUCTION 2500CC (MISCELLANEOUS) ×2 IMPLANT
CANNULA EZ GLIDE AORTIC 21FR (CANNULA) ×3 IMPLANT
CANNULA GUNDRY RCSP 15FR (MISCELLANEOUS) ×1 IMPLANT
CANNULA VENOUS LOW PROF 34X46 (CANNULA) ×2 IMPLANT
CARDIAC SUCTION (MISCELLANEOUS) ×2 IMPLANT
CATH CPB KIT OWEN (MISCELLANEOUS) ×2 IMPLANT
CATH THORACIC 28FR (CATHETERS) IMPLANT
CATH THORACIC 28FR RT ANG (CATHETERS) IMPLANT
CATH THORACIC 36FR (CATHETERS) ×2 IMPLANT
CATH THORACIC 36FR RT ANG (CATHETERS) ×2 IMPLANT
CLIP APPLIE 9.375 MED OPEN (MISCELLANEOUS) IMPLANT
CLIP APPLIE 9.375 SM OPEN (CLIP) IMPLANT
CLIP FOGARTY SPRING 6M (CLIP) IMPLANT
CLIP TI MEDIUM 24 (CLIP) IMPLANT
CLIP TI WIDE RED SMALL 24 (CLIP) IMPLANT
CONN Y 3/8X3/8X3/8  BEN (MISCELLANEOUS)
CONN Y 3/8X3/8X3/8 BEN (MISCELLANEOUS) IMPLANT
COVER SURGICAL LIGHT HANDLE (MISCELLANEOUS) ×2 IMPLANT
CRADLE DONUT ADULT HEAD (MISCELLANEOUS) ×2 IMPLANT
DRAIN CHANNEL 32F RND 10.7 FF (WOUND CARE) ×2 IMPLANT
DRAPE CARDIOVASCULAR INCISE (DRAPES) ×2
DRAPE INCISE IOBAN 66X45 STRL (DRAPES) ×2 IMPLANT
DRAPE SLUSH/WARMER DISC (DRAPES) ×2 IMPLANT
DRAPE SRG 135X102X78XABS (DRAPES) ×1 IMPLANT
DRSG COVADERM 4X14 (GAUZE/BANDAGES/DRESSINGS) ×2 IMPLANT
ELECT REM PT RETURN 9FT ADLT (ELECTROSURGICAL) ×4
ELECTRODE REM PT RTRN 9FT ADLT (ELECTROSURGICAL) ×2 IMPLANT
GAUZE SPONGE 4X4 12PLY STRL (GAUZE/BANDAGES/DRESSINGS) ×4 IMPLANT
GLOVE BIO SURGEON STRL SZ 6 (GLOVE) IMPLANT
GLOVE BIO SURGEON STRL SZ 6.5 (GLOVE) IMPLANT
GLOVE BIO SURGEON STRL SZ7 (GLOVE) IMPLANT
GLOVE BIO SURGEON STRL SZ7.5 (GLOVE) IMPLANT
GLOVE BIOGEL PI IND STRL 6 (GLOVE) IMPLANT
GLOVE BIOGEL PI IND STRL 6.5 (GLOVE) IMPLANT
GLOVE BIOGEL PI IND STRL 7.0 (GLOVE) IMPLANT
GLOVE BIOGEL PI INDICATOR 6 (GLOVE)
GLOVE BIOGEL PI INDICATOR 6.5 (GLOVE)
GLOVE BIOGEL PI INDICATOR 7.0 (GLOVE)
GLOVE EUDERMIC 7 POWDERFREE (GLOVE) IMPLANT
GLOVE ORTHO TXT STRL SZ7.5 (GLOVE) ×4 IMPLANT
GOWN STRL REUS W/ TWL LRG LVL3 (GOWN DISPOSABLE) ×4 IMPLANT
GOWN STRL REUS W/TWL LRG LVL3 (GOWN DISPOSABLE) ×14
HEMOSTAT POWDER SURGIFOAM 1G (HEMOSTASIS) ×10 IMPLANT
INSERT FOGARTY 61MM (MISCELLANEOUS) ×1 IMPLANT
INSERT FOGARTY XLG (MISCELLANEOUS) ×2 IMPLANT
KIT BASIN OR (CUSTOM PROCEDURE TRAY) ×2 IMPLANT
KIT ROOM TURNOVER OR (KITS) ×2 IMPLANT
KIT SUCTION CATH 14FR (SUCTIONS) ×10 IMPLANT
KIT VASOVIEW W/TROCAR VH 2000 (KITS) ×2 IMPLANT
LEAD PACING MYOCARDI (MISCELLANEOUS) ×2 IMPLANT
MARKER GRAFT CORONARY BYPASS (MISCELLANEOUS) ×6 IMPLANT
NS IRRIG 1000ML POUR BTL (IV SOLUTION) ×10 IMPLANT
PACK OPEN HEART (CUSTOM PROCEDURE TRAY) ×2 IMPLANT
PAD ARMBOARD 7.5X6 YLW CONV (MISCELLANEOUS) ×2 IMPLANT
PAD ELECT DEFIB RADIOL ZOLL (MISCELLANEOUS) ×2 IMPLANT
PENCIL BUTTON HOLSTER BLD 10FT (ELECTRODE) ×2 IMPLANT
PUNCH AORTIC ROTATE  4.5MM 8IN (MISCELLANEOUS) ×1 IMPLANT
PUNCH AORTIC ROTATE 4.0MM (MISCELLANEOUS) IMPLANT
PUNCH AORTIC ROTATE 4.5MM 8IN (MISCELLANEOUS) IMPLANT
PUNCH AORTIC ROTATE 5MM 8IN (MISCELLANEOUS) IMPLANT
SOLUTION ANTI FOG 6CC (MISCELLANEOUS) IMPLANT
SPONGE LAP 18X18 X RAY DECT (DISPOSABLE) IMPLANT
SPONGE LAP 4X18 X RAY DECT (DISPOSABLE) IMPLANT
STRIP CLOSURE SKIN 1/2X4 (GAUZE/BANDAGES/DRESSINGS) ×1 IMPLANT
SUT BONE WAX W31G (SUTURE) ×2 IMPLANT
SUT ETHIBOND X763 2 0 SH 1 (SUTURE) ×4 IMPLANT
SUT MNCRL AB 3-0 PS2 18 (SUTURE) ×4 IMPLANT
SUT MNCRL AB 4-0 PS2 18 (SUTURE) ×1 IMPLANT
SUT PDS AB 1 CTX 36 (SUTURE) ×4 IMPLANT
SUT PROLENE 2 0 SH DA (SUTURE) IMPLANT
SUT PROLENE 3 0 SH DA (SUTURE) ×2 IMPLANT
SUT PROLENE 3 0 SH1 36 (SUTURE) IMPLANT
SUT PROLENE 4 0 RB 1 (SUTURE)
SUT PROLENE 4 0 SH DA (SUTURE) IMPLANT
SUT PROLENE 4-0 RB1 .5 CRCL 36 (SUTURE) IMPLANT
SUT PROLENE 5 0 C 1 36 (SUTURE) IMPLANT
SUT PROLENE 6 0 C 1 30 (SUTURE) IMPLANT
SUT PROLENE 7.0 RB 3 (SUTURE) ×8 IMPLANT
SUT PROLENE 8 0 BV175 6 (SUTURE) IMPLANT
SUT PROLENE BLUE 7 0 (SUTURE) ×2 IMPLANT
SUT PROLENE POLY MONO (SUTURE) IMPLANT
SUT SILK  1 MH (SUTURE) ×1
SUT SILK 1 MH (SUTURE) ×1 IMPLANT
SUT STEEL 6MS V (SUTURE) IMPLANT
SUT STEEL STERNAL CCS#1 18IN (SUTURE) IMPLANT
SUT STEEL SZ 6 DBL 3X14 BALL (SUTURE) IMPLANT
SUT VIC AB 1 CTX 36 (SUTURE)
SUT VIC AB 1 CTX36XBRD ANBCTR (SUTURE) IMPLANT
SUT VIC AB 2-0 CT1 27 (SUTURE) ×2
SUT VIC AB 2-0 CT1 TAPERPNT 27 (SUTURE) IMPLANT
SUT VIC AB 2-0 CTX 27 (SUTURE) IMPLANT
SUT VIC AB 3-0 SH 27 (SUTURE)
SUT VIC AB 3-0 SH 27X BRD (SUTURE) IMPLANT
SUT VIC AB 3-0 X1 27 (SUTURE) ×1 IMPLANT
SUT VICRYL 4-0 PS2 18IN ABS (SUTURE) IMPLANT
SUTURE E-PAK OPEN HEART (SUTURE) ×2 IMPLANT
SYSTEM SAHARA CHEST DRAIN ATS (WOUND CARE) ×2 IMPLANT
TAPE CLOTH SURG 4X10 WHT LF (GAUZE/BANDAGES/DRESSINGS) ×1 IMPLANT
TOWEL OR 17X24 6PK STRL BLUE (TOWEL DISPOSABLE) ×4 IMPLANT
TOWEL OR 17X26 10 PK STRL BLUE (TOWEL DISPOSABLE) ×4 IMPLANT
TRAY FOLEY IC TEMP SENS 16FR (CATHETERS) ×2 IMPLANT
TUBING INSUFFLATION (TUBING) ×2 IMPLANT
UNDERPAD 30X30 INCONTINENT (UNDERPADS AND DIAPERS) ×2 IMPLANT
WATER STERILE IRR 1000ML POUR (IV SOLUTION) ×4 IMPLANT

## 2014-03-02 NOTE — Progress Notes (Signed)
  PROGRESS NOTE  MARGARET STEENSTRA WNU:[id number] DOB: October 30, 1965 DOA: 03/01/2014 PCP: Mitzi Hansen, NP  Assessment/Plan: CP: Telemetry,  Aspirin, lipitor Start on protonix 40mg  po qday- takes aleve once daily trop i q6hx3- negative No b blocker due to bradycaria Requested  results of stress testing from [facility] NPO after mn  Gout Cont uloric  Hypertension Cont lisinopril  Hyperglycemia Check hga1c  Code Status: full Family Communication: wife at bedside Disposition Plan:    Consultants:  cards  Procedures:      HPI/Subjective: Numbness down both arms Chest pain this AM  Objective: Filed Vitals:   03/02/14 0851  BP: 106/70  Pulse: 50  Temp: 98.6 F (37 C)  Resp:    No intake or output data in the 24 hours ending 03/02/14 0939 Filed Weights   03/01/14 1713 03/01/14 2037  Weight: 135.172 kg (298 lb) 135.716 kg (299 lb 3.2 oz)    Exam:   General:  A+Ox3, NAD  Cardiovascular: rrr  Respiratory: clear, no wheezing   Data Reviewed: Basic Metabolic Panel:  Recent Labs Lab 03/01/14 1735  NA 134*  K 4.2  CL 104  CO2 27  GLUCOSE 118*  BUN 17  CREATININE 0.88  CALCIUM 9.2   Liver Function Tests: No results for input(s): AST, ALT, ALKPHOS, BILITOT, PROT, ALBUMIN in the last 168 hours. No results for input(s): LIPASE, AMYLASE in the last 168 hours. No results for input(s): AMMONIA in the last 168 hours. CBC:  Recent Labs Lab 03/01/14 1735  WBC 7.4  NEUTROABS 5.5  HGB 14.1  HCT 40.7  MCV 94.7  PLT 201   Cardiac Enzymes:  Recent Labs Lab 03/01/14 1735 03/01/14 2114 03/02/14 0340  TROPONINI 0.03 <0.03 <0.03   BNP (last 3 results) No results for input(s): PROBNP in the last 8760 hours. CBG: No results for input(s): GLUCAP in the last 168 hours.  No results found for this or any previous visit (from the past 240 hour(s)).   Studies: No results found.  Scheduled Meds: . docusate sodium  100 mg Oral QHS  . enoxaparin  (LOVENOX) injection  40 mg Subcutaneous Q24H  . febuxostat  40 mg Oral Daily  . lisinopril  5 mg Oral Daily  . ondansetron  4 mg Oral Q6H  . pantoprazole  40 mg Oral Daily  . polyethylene glycol  17 g Oral Daily  . sodium chloride  3 mL Intravenous Q12H   Continuous Infusions: . sodium chloride 75 mL/hr at 03/01/14 2219   Antibiotics Given (last 72 hours)    None      Active Problems:   Chest pain, rule out acute myocardial infarction   Chest pain    Time spent: 35 min   Alithea Lapage  [facility] Pager [phone]. If 7PM-7AM, please contact night-coverage at [email or web address], password [facility] 03/02/2014, 9:39 AM  LOS: 1 day
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
## 2014-03-02 NOTE — Progress Notes (Signed)
RN callled into room pt c/o of sharp lt mid cp radiating to bilateral arms & upper lip. Pt's vs taken & charted. Pt rated the pain as a 6 out of 10. Pt also c/o of SOB so 2L of O2 were applied via [place]. Pt O2 sat was 98% on RA & went up to 100% on 2 L [place]. Pt given 1 SL nitro & sat up in the bed. Pt's pain down to 5 out of 10 after 1 SL nitro. Pt still feels SOB however. Pt given a second SL nitro but the pain was unchanged at 5 out of 10. Pt now c/o of a h/a & given pain meds for that. EKG showed SB otherwise normal rhythm. Pt's SOB is intermittent it comes & goes. Per pt " feels as if i can't catch my breath." New orders for morphine IV once. Will continue to monitor the pt.Sanda Linger, RN
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
## 2014-03-02 NOTE — OR Nursing (Signed)
Second call to SICU at 2015.

## 2014-03-02 NOTE — Anesthesia Preprocedure Evaluation (Addendum)
Anesthesia Evaluation  Patient identified by MRN, date of birth, ID band Patient awake    Reviewed: Allergy & Precautions, NPO status , Patient's Chart, lab work & pertinent test results  History of Anesthesia Complications Negative for: history of anesthetic complications  Airway Mallampati: III  TM Distance: >3 FB Neck ROM: Full    Dental  (+) Partial Upper, Dental Advisory Given   Pulmonary neg pulmonary ROS,    Pulmonary exam normal       Cardiovascular hypertension, Pt. on medications + CAD     Neuro/Psych negative neurological ROS  negative psych ROS   GI/Hepatic negative GI ROS, Neg liver ROS,   Endo/Other  negative endocrine ROS  Renal/GU negative Renal ROS     Musculoskeletal   Abdominal   Peds  Hematology   Anesthesia Other Findings   Reproductive/Obstetrics                            Anesthesia Physical Anesthesia Plan  ASA: IV and emergent  Anesthesia Plan: General   Post-op Pain Management:    Induction: Intravenous  Airway Management Planned: Oral ETT  Additional Equipment: PA Cath, 3D TEE and Arterial line  Intra-op Plan:   Post-operative Plan: Post-operative intubation/ventilation  Informed Consent: I have reviewed the patients History and Physical, chart, labs and discussed the procedure including the risks, benefits and alternatives for the proposed anesthesia with the patient or authorized representative who has indicated his/her understanding and acceptance.   Dental advisory given  Plan Discussed with: CRNA, Anesthesiologist and Surgeon  Anesthesia Plan Comments:         Anesthesia Quick Evaluation

## 2014-03-02 NOTE — Progress Notes (Addendum)
Yesterday's stress echo report obtained from [facility], normal EF, no wall motion, no sign of ischemia. MD updated on result. Report in patient's shadow chart.   Addendum: patient's CP came back, continue to have CP despite nitro and morphine. Repeat EKG no change from previous EKG.   Addendum: discussed with MD, patient has recurrent CP despite recent negative stress echo. Given his significant FHx of CAD, will proceed with cardiac cath. Risk and benefit of the procedure include bleeding, vascular/renal injury, arrhythmia, MI, stroke, loss of limb or life has been discussed with the patient. He and family member displayed clear understanding and agree to proceed. Overall, benefit outweigh risk.  Signed, Jaree Trinka PA Pager: [phone]  

## 2014-03-02 NOTE — H&P (View-Only) (Signed)
Yesterday's stress echo report obtained from [facility], normal EF, no wall motion, no sign of ischemia. MD updated on result. Report in patient's shadow chart.   Addendum: patient's CP came back, continue to have CP despite nitro and morphine. Repeat EKG no change from previous EKG.   Addendum: discussed with MD, patient has recurrent CP despite recent negative stress echo. Given his significant FHx of CAD, will proceed with cardiac cath. Risk and benefit of the procedure include bleeding, vascular/renal injury, arrhythmia, MI, stroke, loss of limb or life has been discussed with the patient. He and family member displayed clear understanding and agree to proceed. Overall, benefit outweigh risk.  Ramond Dial PA Pager: [phone]

## 2014-03-02 NOTE — Brief Op Note (Addendum)
03/01/2014 - 03/02/2014  7:21 PM  PATIENT:  [name] [name]  49 y.o. male  PRE-OPERATIVE DIAGNOSIS:  Single vessel CAD, acute RCA occlusion, acute MI  POST-OPERATIVE DIAGNOSIS: Single vessel CAD, acute RCA occlusion, acute MI  PROCEDURE:   EMERGENCY CORONARY ARTERY BYPASS GRAFTING x 1 (SVG-RCA) ENDOSCOPIC VEIN HARVEST RIGHT THIGH  SURGEON:    Purcell Nails, MD  ASSISTANTS:  Coral Ceo, PA-C  ANESTHESIA:   Gaylan Gerold, MD  CROSSCLAMP TIME:   21'  CARDIOPULMONARY BYPASS TIME: 30'  FINDINGS:  Good quality SVG conduit for grafting  Good quality target vessel for grafting  COMPLICATIONS: None  BASELINE WEIGHT: 135 kg  PATIENT DISPOSITION:   TO SICU IN STABLE CONDITION  [name] 03/02/2014 8:24 PM

## 2014-03-02 NOTE — Transfer of Care (Signed)
Immediate Anesthesia Transfer of Care Note  Patient: [name] [name]  Procedure(s) Performed: Procedure(s): CORONARY ARTERY BYPASS GRAFTING (CABG), ON PUMP, TIMES ONE, RIGHT GREATER SAPHENOUS VEIN HARVESTED ENDOSCOPICALLY (N/A)  Patient Location: SICU  Anesthesia Type:General  Level of Consciousness: Patient remains intubated per anesthesia plan  Airway & Oxygen Therapy: Patient remains intubated per anesthesia plan and Patient placed on Ventilator (see vital sign flow sheet for setting)  Post-op Assessment: Report given to RN and Post -op Vital signs reviewed and stable  Post vital signs: Reviewed and stable  Last Vitals:  Filed Vitals:   03/02/14 2102  BP: 96/66  Pulse: 84  Temp:   Resp:     Complications: No apparent anesthesia complications

## 2014-03-02 NOTE — Progress Notes (Signed)
Nutrition Brief Note  Patient identified on the Malnutrition Screening Tool (MST) Report. No significant weight changes noted per review of usual weights below.   Wt Readings from Last 15 Encounters:  03/01/14 299 lb 3.2 oz (135.716 kg)  02/03/14 300 lb (136.079 kg)  11/23/12 300 lb (136.079 kg)  01/15/11 290 lb (131.543 kg)    Body mass index is 33.69 kg/(m^2). Patient meets criteria for class 1 obesity based on current BMI.   Current diet order is NPO. Labs and medications reviewed.   No nutrition interventions warranted at this time. If nutrition issues arise, please consult RD.   Joaquin Courts, RD, LDN, CNSC Pager [phone] After Hours Pager [phone]

## 2014-03-02 NOTE — Progress Notes (Signed)
  Echocardiogram 2D Echocardiogram has been performed.  [name] [name] 03/02/2014, 3:34 PM

## 2014-03-02 NOTE — Progress Notes (Signed)
Pt called out with a complaint of 7/10 squeezing chest pain. VSS and one sublingual nitro administered.. Five minutes later pt cp 5/10 and VSS, second sublingual nitro administered.  Five minutes later pt cp still 5/10 squeezing, pt stated he did not want to take another nitro due to severe headache.  Dr. Benjamine Mola notified and orders received to administer 1mg  IV morphine.  Will continue to closely monitor.

## 2014-03-02 NOTE — Interval H&P Note (Signed)
Cath Lab Visit (complete for each Cath Lab visit)  Clinical Evaluation Leading to the Procedure:   ACS: No.  Non-ACS:    Anginal Classification: CCS III  Anti-ischemic medical therapy: Minimal Therapy (1 class of medications)  Non-Invasive Test Results: Low-risk stress test findings: cardiac mortality <1%/year  Prior CABG: No previous CABG      History and Physical Interval Note:  03/02/2014 1:50 PM  [name] [name]  has presented today for surgery, with the diagnosis of CP  The various methods of treatment have been discussed with the patient and family. After consideration of risks, benefits and other options for treatment, the patient has consented to  Procedure(s): LEFT HEART CATHETERIZATION WITH CORONARY ANGIOGRAM (N/A) as a surgical intervention .  The patient's history has been reviewed, patient examined, no change in status, stable for surgery.  I have reviewed the patient's chart and labs.  Questions were answered to the patient's satisfaction.     Lesleigh Noe

## 2014-03-02 NOTE — OR Nursing (Signed)
First call to SICU at 1947.

## 2014-03-02 NOTE — CV Procedure (Addendum)
Left Heart Catheterization with Coronary Angiography and PCI Report  [name] [name]  49 y.o.  male March 21, 1965  Procedure Date: 03/02/2014 Referring Physician: Tobias Alexander, M.D. Primary Cardiologist: Tobias Alexander, M.D.  INDICATIONS: Unstable angina pectoris in the setting of a normal exercise echocardiogram. Prolonged recurring episodes of chest discomfort at rest.  PROCEDURE: 1. Left heart catheterization; 2. Coronary angiography; 3. Left ventriculography; 4. Failed PCI mid RCA with abrupt occlusion requiring emergency open heart surgery  CONSENT:  The risks, benefits, and details of the procedure were explained in detail to the patient. Risks including death, stroke, heart attack, kidney injury, allergy, limb ischemia, bleeding and radiation injury were discussed.  The patient verbalized understanding and wanted to proceed.  Informed written consent was obtained.   PROCEDURE TECHNIQUE:  After Xylocaine anesthesia a 5 French Slender sheath was placed in the right radial artery with an angiocath and the modified Seldinger technique.  Coronary angiography was done using a 5 F JR 4 and JL 3.5 cm diagnostic catheter.  Left ventriculography was done using the JR 4 diagnostic catheter and hand injection.   We reviewed the digital images in the cast be. The patient and I discussed the findings. He was able to visualize the obstruction in the right coronary. The left coronary anatomy was without any evidence of significant obstruction. After some discussion we decided to proceed with PCI and stenting of the right coronary. We did discuss the risks of PCI including the possibility of death, stroke, myocardial infarction, emergency surgery, bleeding, kidney injury, and infection. After some contemplation a consideration we decided to proceed.  The right coronary contains marked tortuosity proximal to the lesion. The lesion lies beyond an acute bend. We proceeded initially using a  bivalirudin bolus and gave a Brilinta loading dose 180 mg by mouth. The JR 4 guide catheter with provided weak support. We will unable to advance the guidewire beyond the second bend without it causing the catheter to disengage. We then used a "Shepherd's crook" right coronary guide catheter. A provided better support and allowed [place] to advance the guidewire beyond the third bend but the guidewire continually ended the acute marginal branch rather than going through the stenosis in the right coronary. Further advancement will cause the guide catheter to back out. After several attempts noted contrast staining in the proximal right coronary. 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 In this situation the pro-water guidewire was left in the acute marginal branch to serve as a landmark. I then use a second guidewire, Cougar, but was unable to direct the guidewire into the distal right coronary. After 20 minutes or more of manipulation, I called [facility] to consider emergency coronary bypass surgery. Dr. Tressie Stalker came to the cath lab and review the images and accepted the patient as an emergency open heart surgery case. While the patient was being prepped for surgery, we upgraded to an RCA XB guide catheter and further unsuccessful attempts to advance a guidewire into the distal right coronary were made. The case was eventually aborted and the patient taken for emergency surgery.   CONTRAST:  Total of 195 cc.  COMPLICATIONS:  Spiral dissection with abrupt closure .  HEMODYNAMICS:  Aortic pressure 135/79 mmHg; LV pressure 138/12 mmHg; LVEDP 21 mmHg  ANGIOGRAPHIC DATA:   The left main coronary artery is widely patent.  The left anterior descending artery is widely patent. The LAD reaches the left ventricular apex. One large diagonal arises  from the mid vessel. No significant obstructions noted in the LAD.Marland Kitchen  The left circumflex artery is is large and widely patent.  There is a very tortuous proximal segment. 2 distal obtuse marginals arise from the vessel and all widely patent.  A moderate size ramus intermedius branch is widely patent..  The right coronary artery is highly tortuous. There are 290 bends followed by a third 90 bend proximal to the 95% stenosis in the mid RCA beyond the origin of an acute marginal branch. The vessel beyond the high-grade obstruction is again tortuous and terminates on a large PDA.Marland Kitchen   PCI RESULTS: Failed PCI with abrupt occlusion of the right coronary due to intimal dissection. Procedure failed because of tortuosity, poor guide support, and inability to cross the distal stenosis with a guidewire.  LEFT VENTRICULOGRAM:  Left ventricular angiogram was done in the 30 RAO projection and revealed a vigorous anterior wall. Overall LV contractility is poorly visualized. Unable to estimate ejection fraction. Echocardiography performed on 03/01/2013 demonstrated "normal EF".   IMPRESSIONS:  1. High-grade obstruction in the mid right coronary beyond several regions of tortuosity. 2. 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 Vessel tortuosity and poor guide support contributed to the poor outcome. 3. Widely patent left coronary system 4. Known normal left ventricular systolic function   RECOMMENDATION:  Emergency coronary bypass grafting per Dr. Tressie Stalker, [facility]. Long discussion with the patient and family prior to surgery. Images were reviewed with the patient's family.

## 2014-03-02 NOTE — Op Note (Signed)
CARDIOTHORACIC SURGERY OPERATIVE NOTE  Date of Procedure: 03/02/2014  Preoperative Diagnosis:   Severe Single-vessel Coronary Artery Disease  Acute Evolving Myocardial Infarction  Postoperative Diagnosis: Same  Procedure:    Emergency Coronary Artery Bypass Grafting x 1   Saphenous Vein Graft to Distal Right Coronary Artery  Endoscopic Vein Harvest from Right Thigh  Surgeon: Salvatore Decent. Cornelius Moras, MD  Assistant: Coral Ceo, PA-C  Anesthesia: Gaylan Gerold, MD and Quita Skye. Krista Blue, MD  Operative Findings:  Good quality SVG conduit for grafting  Good quality target vessel for grafting    BRIEF CLINICAL NOTE AND INDICATIONS FOR SURGERY  Patient is a 49 year old white male with history of hypertension and a family history of premature coronary artery disease who was admitted to the hospital 03/01/2014 with accelerating symptoms of chest pain suspicious for angina pectoris. The patient reportedly has been experiencing exertional shortness of breath and chest discomfort for several months. The patient was evaluated by a cardiologist in [place] who performed a stress echocardiogram on 03/01/2014. Official report of the patient's stress echocardiogram are not available presently. During the procedure the patient reportedly developed symptoms of nausea, diaphoresis during his exercise treadmill test, and he developed chest pain after he returned home. He presented to the emergency department where EKGs revealed no acute ST changes and initial set of cardiac enzymes were negative. The patient subtotally ruled out for acute myocardial infarction. Because of the suspicious nature of his symptoms, he was taken for elective diagnostic catheterization this afternoon by Dr. Katrinka Blazing. The patient was found to have high-grade stenosis of the proximal right coronary artery with otherwise normal coronary anatomy and normal left ventricular systolic function. 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 Numerous attempts to cross the vessel with a guidewire were unsuccessful. Emergent cardiothoracic surgical consultation was requested. Upon arrival to the cath lab the patient is awake and alert complaining of ongoing chest pain. The patient remains hemodynamically stable in sinus rhythm with normal blood pressure. The patient received 1 oral dose of Brilinta immediately prior to attempts at performing PCI.  The patient was seen in consultation in the cath lab and subsequently brought directly to the operating room for emergency surgical revascularization.  The patient and his family were counseled regarding the indications, risks and potential benefits of surgery.  The patient expressed complete understanding and his wife provided permission for surgery because the patient had received intravenous narcotics during his catheterization procedure.    DETAILS OF THE OPERATIVE PROCEDURE  Preparation:  The patient is brought to the operating room on the above mentioned date and central monitoring was established by the anesthesia team including placement of Swan-Ganz catheter and radial arterial line. The patient is placed in the supine position on the operating table.  Intravenous antibiotics are administered. General endotracheal anesthesia is induced uneventfully. A Foley catheter is placed.  Baseline transesophageal echocardiogram was performed.  Findings were notable for severe inferior wall hypokinesis.  LV systolic function otherwise appeared normal.  The patient's chest, abdomen, both groins, and both lower extremities are prepared and draped in a sterile manner. A time out procedure is performed.   Surgical Approach and Conduit Harvest:  The greater saphenous vein is obtained from the patient's right thigh using endoscopic vein  harvest technique. The saphenous vein is notably good quality conduit. After removal of the saphenous vein,  the small surgical incisions in the lower extremity are closed with absorbable suture.  A median sternotomy incision was performed.  The patient is heparinized systemically.   Extracorporeal Cardiopulmonary Bypass and Myocardial Protection:  The pericardium is opened. The ascending aorta is normal in appearance. The ascending aorta and the right atrium are cannulated for cardioplegia bypass.  Adequate heparinization is verified.  The entire pre-bypass portion of the operation was notable for stable hemodynamics.  Cardiopulmonary bypass was begun and the surface of the heart is inspected. Distal target vessels are selected for coronary artery bypass grafting. A cardioplegia cannula is placed in the ascending aorta.  A temperature probe was placed in the interventricular septum.  The patient is allowed to cool passively to [facility] systemic temperature.  The aortic cross clamp is applied and cold blood cardioplegia is delivered initially in an antegrade fashion through the aortic root.  Iced saline slush is applied for topical hypothermia.  The initial cardioplegic arrest is rapid with early diastolic arrest.  Repeat doses of cardioplegia are administered intermittently throughout the entire cross clamp portion of the operation through the aortic root and through subsequently placed vein graft in order to maintain completely flat electrocardiogram and septal myocardial temperature below 15C.  Myocardial protection was felt to be excellent.   Coronary Artery Bypass Grafting:  The distal right coronary artery was grafted using a reversed saphenous vein graft in an end-to-side fashion.  At the site of distal anastomosis the target vessel was good quality and measured approximately 2.0 mm in diameter.  The single proximal vein graft anastomosis was placed directly to the ascending aorta prior to removal  of the aortic cross clamp.   The aortic cross clamp was removed after a total cross clamp time of 21 minutes.   Procedure Completion:  All proximal and distal coronary anastomoses were inspected for hemostasis and appropriate graft orientation. Epicardial pacing wires are fixed to the right ventricular outflow tract and to the right atrial appendage. The patient is rewarmed to 37C temperature. The patient is weaned and disconnected from cardiopulmonary bypass.  The patient's rhythm at separation from bypass was sinus.  The patient was weaned from cardiopulmonary bypass without any inotropic support. Total cardiopulmonary bypass time for the operation was 30 minutes.  Followup transesophageal echocardiogram performed after separation from bypass revealed improved LV function with improved contractility in the inferior wall.  The aortic and venous cannula were removed uneventfully. Protamine was administered to reverse the anticoagulation. The mediastinum and pleural space were inspected for hemostasis and irrigated with saline solution. The mediastinum was drained using 2 chest tubes placed through separate stab incisions inferiorly.  The soft tissues anterior to the aorta were reapproximated loosely. The sternum is closed with double strength sternal wire. The soft tissues anterior to the sternum were closed in multiple layers and the skin is closed with a running subcuticular skin closure.  The post-bypass portion of the operation was notable for stable rhythm and hemodynamics, although the patient did experience one episode of ventricular fibrillation requiring cardioversion shortly after removal of the aortic cross clamp.  Intravenous lidocaine was administered.  No blood products were administered during the operation.  There was diffuse coagulopathy noted, presumably related to preoperative Brilinta administration.   Disposition:  The patient tolerated the procedure well and is transported to  the surgical intensive care in stable condition. There are no intraoperative complications. All sponge instrument and needle counts are verified correct at completion of the operation.    Marilu Favre  Zadie Cleverly MD 03/02/2014 8:30 PM

## 2014-03-02 NOTE — Anesthesia Procedure Notes (Signed)
Procedures   The patient was identified and consent obtained.  TO was performed, and full barrier precautions were used.  The skin was anesthetized with lidocaine.  Once the vein was located with the 22 ga. needle using ultrasound guidance , the wire was inserted into the vein.  The wire location was confirmed with ultrasound.  The insertion site was dilated and the introducer was carefully inserted and sutured in place. The PAC was checked, and floated into the PA.  Once in the PA, the catheter was secured. The patient tolerated the procedure well.  CXR was ordered for PACU. Start: 1743 End: 1753 J. Claybon Jabs, MD

## 2014-03-02 NOTE — Progress Notes (Addendum)
[facility] BRIEF SICU PROGRESS NOTE  Day of Surgery  S/P Procedure(s) (LRB): CORONARY ARTERY BYPASS GRAFTING (CABG), ON PUMP, TIMES ONE, RIGHT GREATER SAPHENOUS VEIN HARVESTED ENDOSCOPICALLY (N/A)   Sedated on vent.  Bleeding around right IJ central line. NSR w/ stable hemodynamics, no drips Chest tube output modest, 200 mL total since OR UOP excellent Labs okay  Plan: Will transfuse platelets given pre-op Brilinta Rx.  D/C radial sheath.  Otherwise continue routine postop  OWEN,CLARENCE H 03/02/2014 9:28 PM

## 2014-03-03 ENCOUNTER — Inpatient Hospital Stay (HOSPITAL_COMMUNITY): Payer: 59

## 2014-03-03 ENCOUNTER — Encounter (HOSPITAL_COMMUNITY): Payer: Self-pay | Admitting: Interventional Cardiology

## 2014-03-03 DIAGNOSIS — Z951 Presence of aortocoronary bypass graft: Secondary | ICD-10-CM

## 2014-03-03 LAB — POCT I-STAT 3, ART BLOOD GAS (G3+)
ACID-BASE DEFICIT: 4 mmol/L — AB (ref 0.0–2.0)
Acid-base deficit: 4 mmol/L — ABNORMAL HIGH (ref 0.0–2.0)
Acid-base deficit: 3 mmol/L — ABNORMAL HIGH (ref 0.0–2.0)
Bicarbonate: 20.2 mEq/L (ref 20.0–24.0)
Bicarbonate: 19.4 mEq/L — ABNORMAL LOW (ref 20.0–24.0)
Bicarbonate: 20 mEq/L (ref 20.0–24.0)
O2 SAT: 100 %
O2 SAT: 99 %
O2 Saturation: 100 %
PCO2 ART: 32 mmHg — AB (ref 35.0–45.0)
PH ART: 7.409 (ref 7.350–7.450)
PO2 ART: 171 mmHg — AB (ref 80.0–100.0)
PO2 ART: 141 mmHg — AB (ref 80.0–100.0)
Patient temperature: 38.5
Patient temperature: 38.5
TCO2: 21 mmol/L (ref 0–100)
TCO2: 20 mmol/L (ref 0–100)
TCO2: 21 mmol/L (ref 0–100)
pCO2 arterial: 30.6 mmHg — ABNORMAL LOW (ref 35.0–45.0)
pCO2 arterial: 30.8 mmHg — ABNORMAL LOW (ref 35.0–45.0)
pH, Arterial: 7.416 (ref 7.350–7.450)
pH, Arterial: 7.426 (ref 7.350–7.450)
pO2, Arterial: 177 mmHg — ABNORMAL HIGH (ref 80.0–100.0)

## 2014-03-03 LAB — CBC
HCT: 24.5 % — ABNORMAL LOW (ref 39.0–52.0)
HEMATOCRIT: 20.8 % — AB (ref 39.0–52.0)
HEMOGLOBIN: 8.5 g/dL — AB (ref 13.0–17.0)
Hemoglobin: 7.4 g/dL — ABNORMAL LOW (ref 13.0–17.0)
MCH: 32.6 pg (ref 26.0–34.0)
MCH: 31 pg (ref 26.0–34.0)
MCHC: 35.1 g/dL (ref 30.0–36.0)
MCHC: 34.7 g/dL (ref 30.0–36.0)
MCV: 92.9 fL (ref 78.0–100.0)
MCV: 89.4 fL (ref 78.0–100.0)
PLATELETS: 173 10*3/uL (ref 150–400)
Platelets: 208 10*3/uL (ref 150–400)
RBC: 2.24 MIL/uL — AB (ref 4.22–5.81)
RBC: 2.74 MIL/uL — AB (ref 4.22–5.81)
RDW: 12.4 % (ref 11.5–15.5)
RDW: 15.2 % (ref 11.5–15.5)
WBC: 14 10*3/uL — ABNORMAL HIGH (ref 4.0–10.5)
WBC: 12.2 10*3/uL — ABNORMAL HIGH (ref 4.0–10.5)

## 2014-03-03 LAB — GLUCOSE, CAPILLARY
GLUCOSE-CAPILLARY: 106 mg/dL — AB (ref 70–99)
GLUCOSE-CAPILLARY: 106 mg/dL — AB (ref 70–99)
GLUCOSE-CAPILLARY: 132 mg/dL — AB (ref 70–99)
GLUCOSE-CAPILLARY: 165 mg/dL — AB (ref 70–99)
Glucose-Capillary: 110 mg/dL — ABNORMAL HIGH (ref 70–99)
Glucose-Capillary: 122 mg/dL — ABNORMAL HIGH (ref 70–99)
Glucose-Capillary: 127 mg/dL — ABNORMAL HIGH (ref 70–99)
Glucose-Capillary: 127 mg/dL — ABNORMAL HIGH (ref 70–99)
Glucose-Capillary: 132 mg/dL — ABNORMAL HIGH (ref 70–99)
Glucose-Capillary: 134 mg/dL — ABNORMAL HIGH (ref 70–99)
Glucose-Capillary: 135 mg/dL — ABNORMAL HIGH (ref 70–99)
Glucose-Capillary: 136 mg/dL — ABNORMAL HIGH (ref 70–99)
Glucose-Capillary: 137 mg/dL — ABNORMAL HIGH (ref 70–99)
Glucose-Capillary: 138 mg/dL — ABNORMAL HIGH (ref 70–99)
Glucose-Capillary: 138 mg/dL — ABNORMAL HIGH (ref 70–99)
Glucose-Capillary: 139 mg/dL — ABNORMAL HIGH (ref 70–99)
Glucose-Capillary: 148 mg/dL — ABNORMAL HIGH (ref 70–99)

## 2014-03-03 LAB — POCT I-STAT, CHEM 8
BUN: 16 mg/dL (ref 6–23)
CALCIUM ION: 1.17 mmol/L (ref 1.12–1.23)
CHLORIDE: 106 mmol/L (ref 96–112)
Creatinine, Ser: 0.9 mg/dL (ref 0.50–1.35)
Glucose, Bld: 138 mg/dL — ABNORMAL HIGH (ref 70–99)
HCT: 25 % — ABNORMAL LOW (ref 39.0–52.0)
Hemoglobin: 8.5 g/dL — ABNORMAL LOW (ref 13.0–17.0)
Potassium: 4 mmol/L (ref 3.5–5.1)
Sodium: 140 mmol/L (ref 135–145)
TCO2: 18 mmol/L (ref 0–100)

## 2014-03-03 LAB — LIPID PANEL
CHOL/HDL RATIO: 4.3 ratio
Cholesterol: 107 mg/dL (ref 0–200)
HDL: 25 mg/dL — AB (ref >39)
LDL Cholesterol: 63 mg/dL (ref 0–99)
TRIGLYCERIDES: 94 mg/dL (ref <150)
VLDL: 19 mg/dL (ref 0–40)

## 2014-03-03 LAB — BASIC METABOLIC PANEL
Anion gap: 3 — ABNORMAL LOW (ref 5–15)
BUN: 12 mg/dL (ref 6–23)
CHLORIDE: 111 mmol/L (ref 96–112)
CO2: 25 mmol/L (ref 19–32)
CREATININE: 0.89 mg/dL (ref 0.50–1.35)
Calcium: 8.1 mg/dL — ABNORMAL LOW (ref 8.4–10.5)
GFR calc Af Amer: >90 mL/min (ref >90)
GLUCOSE: 147 mg/dL — AB (ref 70–99)
Potassium: 4 mmol/L (ref 3.5–5.1)
SODIUM: 139 mmol/L (ref 135–145)

## 2014-03-03 LAB — PREPARE PLATELET PHERESIS: Unit division: 0

## 2014-03-03 LAB — MAGNESIUM
Magnesium: 2.1 mg/dL (ref 1.5–2.5)
Magnesium: 1.9 mg/dL (ref 1.5–2.5)

## 2014-03-03 LAB — PREPARE RBC (CROSSMATCH)

## 2014-03-03 LAB — CREATININE, SERUM
CREATININE: 1.01 mg/dL (ref 0.50–1.35)
GFR calc Af Amer: >90 mL/min (ref >90)
GFR calc non Af Amer: 86 mL/min — ABNORMAL LOW (ref >90)

## 2014-03-03 MED ORDER — MORPHINE SULFATE 2 MG/ML IJ SOLN
2.0000 mg | INTRAMUSCULAR | Status: DC | PRN
  Administered 2014-03-03 – 2014-03-04 (×5): 2 mg via INTRAVENOUS
  Filled 2014-03-03 (×5): qty 1

## 2014-03-03 MED ORDER — SODIUM CHLORIDE 0.9 % IV SOLN: Freq: Once | INTRAVENOUS | Status: DC

## 2014-03-03 MED ORDER — ALLOPURINOL 300 MG PO TABS
600.0000 mg | ORAL_TABLET | Freq: Every day | ORAL | Status: DC
  Administered 2014-03-03 – 2014-03-07 (×5): 600 mg via ORAL
  Filled 2014-03-03 (×5): qty 2

## 2014-03-03 MED ORDER — FUROSEMIDE 10 MG/ML IJ SOLN
20.0000 mg | Freq: Four times a day (QID) | INTRAMUSCULAR | Status: AC
  Administered 2014-03-03 – 2014-03-04 (×3): 20 mg via INTRAVENOUS
  Filled 2014-03-03 (×3): qty 2

## 2014-03-03 MED ORDER — INSULIN ASPART 100 UNIT/ML ~~LOC~~ SOLN
0.0000 [IU] | SUBCUTANEOUS | Status: DC
  Administered 2014-03-03 (×2): 2 [IU] via SUBCUTANEOUS

## 2014-03-03 MED ORDER — KETOROLAC TROMETHAMINE 30 MG/ML IJ SOLN
30.0000 mg | Freq: Four times a day (QID) | INTRAMUSCULAR | Status: AC
  Administered 2014-03-03 – 2014-03-04 (×5): 30 mg via INTRAVENOUS
  Filled 2014-03-03 (×5): qty 1

## 2014-03-03 MED ORDER — INSULIN DETEMIR 100 UNIT/ML ~~LOC~~ SOLN
28.0000 [IU] | Freq: Once | SUBCUTANEOUS | Status: AC
  Administered 2014-03-03: 28 [IU] via SUBCUTANEOUS
  Filled 2014-03-03: qty 0.28

## 2014-03-03 NOTE — Progress Notes (Signed)
[facility] BRIEF SICU PROGRESS NOTE  1 Day Post-Op  S/P Procedure(s) (LRB): CORONARY ARTERY BYPASS GRAFTING (CABG), ON PUMP, TIMES ONE, RIGHT GREATER SAPHENOUS VEIN HARVESTED ENDOSCOPICALLY (N/A)   Stable day Maintaining NSR w/ stable BP off Neo O2 sats 100% on 2 L/min UOP adequate Hgb increased 8.5  Plan: Continue current plan  [name] [name] 03/03/2014 6:37 PM

## 2014-03-03 NOTE — Progress Notes (Signed)
Dr Cornelius Moras called r/t resulted Hg with 2.5g drop, Pt status reviewed as well as vital signs, CT drainage, urine o/p and mentation. Orders received for PRBC and platelets to be transfused. Will cont' to monitor and assess.

## 2014-03-03 NOTE — Plan of Care (Signed)
Problem: Phase I - Pre-Op Goal: Point person for discharge identified Outcome: Completed/Met Date Met:  03/03/14 [name] [name] (wife) indentified as point person/contact. Goal: Pre-Op Consults completed as appropriate Outcome: Not Met (add Reason) Emergent CABG from cath lab            Goal: Pre-Op Education completed Outcome: Not Met (add Reason) Emergent CABG from cath lab
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
## 2014-03-03 NOTE — Progress Notes (Signed)
PROGRESS NOTE  Subjective:   [name] [name] is a 49 yo with CP, Had a stress test at [facility] and developed severe CP following that  Came to our hospital on 1/29 and had an urgent cath  He had a severe stenosis in the distal RCA  attempted PCI was complicated by a spiral dissection  He was taked to emergent surgery. Is awake and alert in the SICU this am.  Hemodynamics are normal   Objective:    Vital Signs:   Temp:  [96.1 F (35.6 C)-101.5 F (38.6 C)] 100.8 F (38.2 C) (01/30 1230) Pulse Rate:  [55-114] 98 (01/30 1230) Resp:  [0-27] 25 (01/30 1230) BP: (68-135)/(39-87) 124/63 mmHg (01/30 1230) SpO2:  [86 %-100 %] 100 % (01/30 1230) Arterial Line BP: (68-137)/(40-76) 116/61 mmHg (01/30 1230) FiO2 (%):  [40 %-50 %] 40 % (01/30 0810) Weight:  [299 lb 2.6 oz (135.7 kg)-310 lb 10.1 oz (140.9 kg)] 310 lb 10.1 oz (140.9 kg) (01/30 0445)  Last BM Date: 03/02/14   24-hour weight change: Weight change: 1 lb 2.6 oz (0.528 kg)  Weight trends: Filed Weights   03/01/14 2037 03/02/14 2200 03/03/14 0445  Weight: 299 lb 3.2 oz (135.716 kg) 299 lb 2.6 oz (135.7 kg) 310 lb 10.1 oz (140.9 kg)    Intake/Output:  01/29 0701 - 01/30 0700 In: 2878.6 [I.V.:4260.2; Blood:1023; NG/GT:150; IV Piggyback:1400] Out: 3205 [Urine:1745; Emesis/NG output:300; Chest Tube:1160] Total I/O In: 888 [I.V.:298; Blood:510; NG/GT:30; IV Piggyback:50] Out: 320 [Urine:220; Chest Tube:100]   Physical Exam: BP 124/63 mmHg  Pulse 98  Temp(Src) 100.8 F (38.2 C) (Core (Comment))  Resp 25  Ht 6\' 7"  (2.007 m)  Wt 310 lb 10.1 oz (140.9 kg)  BMI 34.98 kg/m2  SpO2 100%  Wt Readings from Last 3 Encounters:  03/03/14 310 lb 10.1 oz (140.9 kg)  02/03/14 300 lb (136.079 kg)  11/23/12 300 lb (136.079 kg)    General: Vital signs reviewed and noted.   Head: Normocephalic, atraumatic.  Eyes: conjunctivae/corneas clear.  EOM's intact.   Throat: normal  Neck:  normal   Lungs:    clear   Heart:   RR     Abdomen:  Soft, non-tender, non-distended    Extremities: Good pulses, trace edema    Neurologic: A&O X3, CN II - XII are grossly intact.   Psych: Normal     Labs: BMET:  Recent Labs  03/01/14 1735  03/02/14 1950 03/02/14 2107 03/03/14 0307  NA 134*  < > 138 139 139  K 4.2  < > 4.1 4.2 4.0  CL 104  < > 104  --  111  CO2 27  --   --   --  25  GLUCOSE 118*  < > 129* 112* 147*  BUN 17  < > 11  --  12  CREATININE 0.88  < > 0.80  --  0.89  CALCIUM 9.2  --   --   --  8.1*  MG  --   --   --   --  2.1  < > = values in this interval not displayed.  Liver function tests: No results for input(s): AST, ALT, ALKPHOS, BILITOT, PROT, ALBUMIN in the last 72 hours. No results for input(s): LIPASE, AMYLASE in the last 72 hours.  CBC:  Recent Labs  03/01/14 1735  03/02/14 2100 03/02/14 2107 03/03/14 0307  WBC 7.4  --  15.0*  --  14.0*  NEUTROABS 5.5  --   --   --   --  HGB 14.1  < > 10.1* 10.2* 7.4*  HCT 40.7  < > 28.3* 30.0* 20.8*  MCV 94.7  --  93.1  --  92.9  PLT 201  < > 155  --  208  < > = values in this interval not displayed.  Cardiac Enzymes:  Recent Labs  03/01/14 1735 03/01/14 2114 03/02/14 0340 03/02/14 0915  TROPONINI 0.03 <0.03 <0.03 <0.03    Coagulation Studies:  Recent Labs  03/02/14 1300 03/02/14 2100  LABPROT 13.2 18.0*  INR 0.99 1.47    Other: Invalid input(s): POCBNP No results for input(s): DDIMER in the last 72 hours. No results for input(s): HGBA1C in the last 72 hours. No results for input(s): CHOL, HDL, LDLCALC, TRIG, CHOLHDL in the last 72 hours. No results for input(s): TSH, T4TOTAL, T3FREE, THYROIDAB in the last 72 hours.  Invalid input(s): FREET3 No results for input(s): VITAMINB12, FOLATE, FERRITIN, TIBC, IRON, RETICCTPCT in the last 72 hours.   Other results:  presonal review of EKG :  Sinus tach.,  Inf. MI  Personally review of tele:  NSR  Medications:    Infusions: . sodium chloride    . insulin (NOVOLIN-R)  infusion 4.2 Units/hr (03/03/14 0600)  . phenylephrine (NEO-SYNEPHRINE) Adult infusion Stopped (03/03/14 1200)    Scheduled Medications: . sodium chloride   Intravenous Once  . acetaminophen  1,000 mg Oral 4 times per day  . allopurinol  600 mg Oral Daily  . aspirin EC  325 mg Oral Daily  . bisacodyl  10 mg Oral Daily   Or  . bisacodyl  10 mg Rectal Daily  . cefUROXime (ZINACEF)  IV  1.5 g Intravenous Q12H  . docusate sodium  200 mg Oral Daily  . insulin aspart  0-24 Units Subcutaneous 6 times per day  . insulin regular  0-10 Units Intravenous TID WC  . ketorolac  30 mg Intravenous 4 times per day  . metoprolol tartrate  12.5 mg Oral BID  . [START ON 03/04/2014] pantoprazole  40 mg Oral Daily  . sodium chloride  3 mL Intravenous Q12H    Assessment/ Plan:      S/P emergency CABG x 1: for a spiral dissection of the distal RCA    Essential hypertension - was on lisinopril at home    Unstable angina - better after emergent surgery   Will get a lipid profile   Disposition:  Length of Stay: 2  Alvia Grove., MD, [facility] 03/03/2014, 12:45 PM Office [phone] Pager [phone]

## 2014-03-03 NOTE — Procedures (Signed)
Extubation Procedure Note  Patient Details:   Name: [name] [name] DOB: 03-01-1965 MRN: [id number]   Airway Documentation:     Evaluation  O2 sats: stable throughout Complications: No apparent complications Patient did tolerate procedure well. Bilateral Breath Sounds: Clear Suctioning: Airway Yes   Pt tolerated SICU rapid wean protocol, VC, -20 NIF, positive for cuff leak, extubated to 4L [place]. No dyspnea or stridor noted after extubation. Pt resting comfortably at this time, achieved on IS. RT will continue to monitor.   Armando Gang 03/03/2014, 9:08 AM

## 2014-03-03 NOTE — Progress Notes (Signed)
Called to 2S04 to removed R Radial TR band. Pt. Alert and oriented x 4, R upper extremeity noted with 6 Fr sheath sutured in with no pressure line attached. R Radial pulse proximal to the R radial artery was auscaltated with the assistance of a doppler. R hand noted with pt. C/O slight paraesthesia to R pinky finger. Cap refill is estimated to be about 3 seconds. Unable to draw back heme from sheath. Sutures removed and TR band applied with pt.tolerating the removal very well. 12 CC of air inflated into the TR Band at approx. Noon (1200) time. Pre and post assessments of the arteriotomy are level "0". Brett Canales, RN at bedside and aware. Elevated R upper extremity upon a pillows at the lever of the heart.Pt. Given verbal instructions regarding not flexing the R upper wrist/extremity, and to maintain the R upper extremity elevation at the level of the heart. Pt. Verbalizes understanding of all these verbal instructions as he repeats them back to me. Pt. Left in the care of Brett Canales, RN with Brett Canales feeling confident about the care of the TR band deflation protocal.

## 2014-03-04 ENCOUNTER — Inpatient Hospital Stay (HOSPITAL_COMMUNITY): Payer: 59

## 2014-03-04 DIAGNOSIS — I209 Angina pectoris, unspecified: Secondary | ICD-10-CM

## 2014-03-04 DIAGNOSIS — I10 Essential (primary) hypertension: Secondary | ICD-10-CM

## 2014-03-04 LAB — GLUCOSE, CAPILLARY
GLUCOSE-CAPILLARY: 90 mg/dL (ref 70–99)
Glucose-Capillary: 103 mg/dL — ABNORMAL HIGH (ref 70–99)
Glucose-Capillary: 106 mg/dL — ABNORMAL HIGH (ref 70–99)
Glucose-Capillary: 98 mg/dL (ref 70–99)

## 2014-03-04 LAB — BASIC METABOLIC PANEL
Anion gap: 6 (ref 5–15)
BUN: 19 mg/dL (ref 6–23)
CHLORIDE: 107 mmol/L (ref 96–112)
CO2: 23 mmol/L (ref 19–32)
Calcium: 8 mg/dL — ABNORMAL LOW (ref 8.4–10.5)
Creatinine, Ser: 1.05 mg/dL (ref 0.50–1.35)
GFR calc Af Amer: >90 mL/min (ref >90)
GFR calc non Af Amer: 82 mL/min — ABNORMAL LOW (ref >90)
Glucose, Bld: 113 mg/dL — ABNORMAL HIGH (ref 70–99)
Potassium: 4.1 mmol/L (ref 3.5–5.1)
Sodium: 136 mmol/L (ref 135–145)

## 2014-03-04 LAB — LIPID PANEL
CHOLESTEROL: 102 mg/dL (ref 0–200)
HDL: 26 mg/dL — ABNORMAL LOW (ref >39)
LDL Cholesterol: 58 mg/dL (ref 0–99)
Total CHOL/HDL Ratio: 3.9 RATIO
Triglycerides: 91 mg/dL (ref <150)
VLDL: 18 mg/dL (ref 0–40)

## 2014-03-04 LAB — CBC
HCT: 23.5 % — ABNORMAL LOW (ref 39.0–52.0)
HEMOGLOBIN: 8.1 g/dL — AB (ref 13.0–17.0)
MCH: 31.3 pg (ref 26.0–34.0)
MCHC: 34.5 g/dL (ref 30.0–36.0)
MCV: 90.7 fL (ref 78.0–100.0)
PLATELETS: 145 10*3/uL — AB (ref 150–400)
RBC: 2.59 MIL/uL — AB (ref 4.22–5.81)
RDW: 15.5 % (ref 11.5–15.5)
WBC: 12.7 10*3/uL — ABNORMAL HIGH (ref 4.0–10.5)

## 2014-03-04 LAB — PREPARE PLATELET PHERESIS: UNIT DIVISION: 0

## 2014-03-04 MED ORDER — POLYSACCHARIDE IRON COMPLEX 150 MG PO CAPS
150.0000 mg | ORAL_CAPSULE | Freq: Every day | ORAL | Status: DC
  Administered 2014-03-05 – 2014-03-07 (×3): 150 mg via ORAL
  Filled 2014-03-04 (×3): qty 1

## 2014-03-04 MED ORDER — FUROSEMIDE 40 MG PO TABS
40.0000 mg | ORAL_TABLET | Freq: Two times a day (BID) | ORAL | Status: AC
  Administered 2014-03-04 – 2014-03-06 (×4): 40 mg via ORAL
  Filled 2014-03-04 (×5): qty 1

## 2014-03-04 MED ORDER — SODIUM CHLORIDE 0.9 % IJ SOLN
3.0000 mL | Freq: Two times a day (BID) | INTRAMUSCULAR | Status: DC
  Administered 2014-03-04 – 2014-03-07 (×5): 3 mL via INTRAVENOUS

## 2014-03-04 MED ORDER — SODIUM CHLORIDE 0.9 % IV SOLN: 250.0000 mL | INTRAVENOUS | Status: DC | PRN

## 2014-03-04 MED ORDER — MOVING RIGHT ALONG BOOK
Freq: Once | Status: AC
  Administered 2014-03-04: 11:00:00
  Filled 2014-03-04: qty 1

## 2014-03-04 MED ORDER — FOLIC ACID 1 MG PO TABS
1.0000 mg | ORAL_TABLET | Freq: Every day | ORAL | Status: DC
  Administered 2014-03-05 – 2014-03-07 (×3): 1 mg via ORAL
  Filled 2014-03-04 (×3): qty 1

## 2014-03-04 MED ORDER — POTASSIUM CHLORIDE CRYS ER 20 MEQ PO TBCR
20.0000 meq | EXTENDED_RELEASE_TABLET | Freq: Two times a day (BID) | ORAL | Status: AC
  Administered 2014-03-04 – 2014-03-05 (×4): 20 meq via ORAL
  Filled 2014-03-04 (×5): qty 1

## 2014-03-04 MED ORDER — SODIUM CHLORIDE 0.9 % IJ SOLN: 3.0000 mL | INTRAMUSCULAR | Status: DC | PRN

## 2014-03-04 MED ORDER — ATORVASTATIN CALCIUM 10 MG PO TABS
10.0000 mg | ORAL_TABLET | Freq: Every day | ORAL | Status: DC
  Administered 2014-03-05 – 2014-03-07 (×3): 10 mg via ORAL
  Filled 2014-03-04 (×3): qty 1

## 2014-03-04 NOTE — Progress Notes (Signed)
[address]       [name] [id number]             [phone]        CARDIOTHORACIC SURGERY PROGRESS NOTE   R2 Days Post-Op Procedure(s) (LRB): CORONARY ARTERY BYPASS GRAFTING (CABG), ON PUMP, TIMES ONE, RIGHT GREATER SAPHENOUS VEIN HARVESTED ENDOSCOPICALLY (N/A)  Subjective: Looks good.  Feels better.  Mild soreness in chest  Objective: Vital signs: BP Readings from Last 1 Encounters:  03/04/14 106/54   Pulse Readings from Last 1 Encounters:  03/04/14 79   Resp Readings from Last 1 Encounters:  03/04/14 13   Temp Readings from Last 1 Encounters:  03/04/14 97.8 F (36.6 C) Oral    Hemodynamics: PAP: (15-18)/(8-11) 15/8 mmHg  Physical Exam:  Rhythm:   sinus  Breath sounds: clear  Heart sounds:  RRR  Incisions:  Dressing dry, intact  Abdomen:  Soft, non-distended, non-tender  Extremities:  Warm, well-perfused  Chest tubes:  Low volume thin serosanguinous output   Intake/Output from previous day: 01/30 0701 - 01/31 0700 In: 1221.4 [P.O.:150; I.V.:431.4; Blood:510; NG/GT:30; IV Piggyback:100] Out: 2205 [Urine:1845; Chest Tube:360] Intake/Output this shift: Total I/O In: 240 [P.O.:240] Out: 265 [Urine:175; Chest Tube:90]  Lab Results:  CBC: Recent Labs  03/03/14 1620 03/03/14 1624 03/04/14 0215  WBC 12.2*  --  12.7*  HGB 8.5* 8.5* 8.1*  HCT 24.5* 25.0* 23.5*  PLT 173  --  145*    BMET:  Recent Labs  03/03/14 0307  03/03/14 1624 03/04/14 0215  NA 139  --  140 136  K 4.0  --  4.0 4.1  CL 111  --  106 107  CO2 25  --   --  23  GLUCOSE 147*  --  138* 113*  BUN 12  --  16 19  CREATININE 0.89  < > 0.90 1.05  CALCIUM 8.1*  --   --  8.0*  < > = values in this interval not displayed.   CBG (last 3)   Recent Labs  03/03/14 2024 03/03/14 2342 03/04/14 0734  GLUCAP 110* 106* 98    ABG    Component Value Date/Time   PHART 7.426 03/03/2014 1040   PCO2ART 30.8* 03/03/2014 1040   PO2ART 141.0* 03/03/2014 1040   HCO3  20.0 03/03/2014 1040   TCO2 18 03/03/2014 1624   ACIDBASEDEF 3.0* 03/03/2014 1040   O2SAT 99.0 03/03/2014 1040    CXR: PORTABLE CHEST - 1 VIEW  COMPARISON: Chest x-ray 03/03/2014.  FINDINGS: Patient has been extubated. Previously noted nasogastric tube and Swan-Ganz catheter have been removed. No pneumothorax. Mediastinal drain remains in position. Lung volumes are low. Worsening opacity in the left lung base, presumably some postoperative atelectasis. Small left pleural effusion. No evidence of pulmonary edema. Cardiomediastinal silhouette within normal limits for this postoperative patient. Median sternotomy wires.  IMPRESSION: 1. Postoperative changes and support apparatus, as above. 2. Low lung volumes with worsening left lower lobe subsegmental atelectasis and small left pleural effusion.   Electronically Signed  By: Trudie Reed M.D.  On: 03/04/2014 08:32  Assessment/Plan: S/P Procedure(s) (LRB): CORONARY ARTERY BYPASS GRAFTING (CABG), ON PUMP, TIMES ONE, RIGHT GREATER SAPHENOUS VEIN HARVESTED ENDOSCOPICALLY (N/A)  Doing well POD2 Expected post op acute blood loss anemia, Hgb down slightly 8.1 Expected post op atelectasis, mild Expected post op volume excess, mild   Mobilize  D/C tubes  Iron and folate  Transfer floor   [name] [name] 03/04/2014 10:13 AM

## 2014-03-04 NOTE — Progress Notes (Signed)
Report called to [facility] RN on 2w11

## 2014-03-04 NOTE — Anesthesia Postprocedure Evaluation (Addendum)
Anesthesia Post Note  Patient: [name] [name]  Procedure(s) Performed: Procedure(s) (LRB): CORONARY ARTERY BYPASS GRAFTING (CABG), ON PUMP, TIMES ONE, RIGHT GREATER SAPHENOUS VEIN HARVESTED ENDOSCOPICALLY (N/A)  Anesthesia type: General  Patient location: ICU  Post pain: Pain level controlled  Post assessment: Post-op Vital signs reviewed  Last Vitals: BP 105/51 mmHg  Pulse 81  Temp(Src) 37.7 C (Oral)  Resp 18  Ht 6\' 7"  (2.007 m)  Wt 307 lb (139.254 kg)  BMI 34.57 kg/m2  SpO2 98%  Post vital signs: Reviewed  Level of consciousness: sedated/intubated  Complications: No apparent anesthesia complications
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
## 2014-03-05 ENCOUNTER — Inpatient Hospital Stay (HOSPITAL_COMMUNITY): Payer: 59

## 2014-03-05 ENCOUNTER — Encounter (HOSPITAL_COMMUNITY): Payer: Self-pay | Admitting: Physician Assistant

## 2014-03-05 DIAGNOSIS — D500 Iron deficiency anemia secondary to blood loss (chronic): Secondary | ICD-10-CM

## 2014-03-05 DIAGNOSIS — I2510 Atherosclerotic heart disease of native coronary artery without angina pectoris: Secondary | ICD-10-CM

## 2014-03-05 LAB — BASIC METABOLIC PANEL
ANION GAP: 5 (ref 5–15)
BUN: 22 mg/dL (ref 6–23)
CHLORIDE: 106 mmol/L (ref 96–112)
CO2: 25 mmol/L (ref 19–32)
Calcium: 8.1 mg/dL — ABNORMAL LOW (ref 8.4–10.5)
Creatinine, Ser: 1.09 mg/dL (ref 0.50–1.35)
GFR calc Af Amer: >90 mL/min (ref >90)
GFR, EST NON AFRICAN AMERICAN: 79 mL/min — AB (ref >90)
GLUCOSE: 114 mg/dL — AB (ref 70–99)
POTASSIUM: 4.1 mmol/L (ref 3.5–5.1)
Sodium: 136 mmol/L (ref 135–145)

## 2014-03-05 LAB — CBC
HCT: 20.5 % — ABNORMAL LOW (ref 39.0–52.0)
Hemoglobin: 7.1 g/dL — ABNORMAL LOW (ref 13.0–17.0)
MCH: 32.3 pg (ref 26.0–34.0)
MCHC: 34.6 g/dL (ref 30.0–36.0)
MCV: 93.2 fL (ref 78.0–100.0)
Platelets: 107 10*3/uL — ABNORMAL LOW (ref 150–400)
RBC: 2.2 MIL/uL — AB (ref 4.22–5.81)
RDW: 14.7 % (ref 11.5–15.5)
WBC: 9.4 10*3/uL (ref 4.0–10.5)

## 2014-03-05 LAB — HEMOGLOBIN A1C
HEMOGLOBIN A1C: 5.7 % — AB (ref 4.8–5.6)
Mean Plasma Glucose: 117 mg/dL

## 2014-03-05 LAB — PREPARE RBC (CROSSMATCH)

## 2014-03-05 LAB — BLOOD PRODUCT ORDER (VERBAL) VERIFICATION

## 2014-03-05 MED ORDER — LISINOPRIL 2.5 MG PO TABS
2.5000 mg | ORAL_TABLET | Freq: Every day | ORAL | Status: DC
  Administered 2014-03-05: 2.5 mg via ORAL
  Filled 2014-03-05 (×2): qty 1

## 2014-03-05 MED ORDER — SODIUM CHLORIDE 0.9 % IV SOLN: Freq: Once | INTRAVENOUS | Status: AC

## 2014-03-05 MED ORDER — FUROSEMIDE 10 MG/ML IJ SOLN
40.0000 mg | Freq: Once | INTRAMUSCULAR | Status: AC
Start: 2014-03-05 — End: 2014-03-05
  Administered 2014-03-05: 40 mg via INTRAVENOUS
  Filled 2014-03-05 (×2): qty 4

## 2014-03-05 MED FILL — Sodium Chloride IV Soln 0.9%: INTRAVENOUS | Qty: 2000 | Status: AC

## 2014-03-05 MED FILL — Sodium Chloride IV Soln 0.9%: INTRAVENOUS | Qty: 50 | Status: AC

## 2014-03-05 MED FILL — Lidocaine HCl IV Inj 20 MG/ML: INTRAVENOUS | Qty: 5 | Status: AC

## 2014-03-05 MED FILL — Sodium Bicarbonate IV Soln 8.4%: INTRAVENOUS | Qty: 50 | Status: AC

## 2014-03-05 MED FILL — Heparin Sodium (Porcine) Inj 1000 Unit/ML: INTRAMUSCULAR | Qty: 10 | Status: AC

## 2014-03-05 MED FILL — Electrolyte-R (PH 7.4) Solution: INTRAVENOUS | Qty: 3000 | Status: AC

## 2014-03-05 MED FILL — Mannitol IV Soln 20%: INTRAVENOUS | Qty: 500 | Status: AC

## 2014-03-05 NOTE — Progress Notes (Signed)
CARDIAC REHAB PHASE I   PRE:  Rate/Rhythm: 85 SR    BP: lying 100/60, sitting 90/60, standing 104/64    SaO2: 94 RA  MODE:  Ambulation: 350 ft   POST:  Rate/Rhythm: 100 ST    BP: sitting 120/70     SaO2: 98 RA  Pt feeling weak but progressing. Dizzy upon sitting but resolved after rest and then standing. BP stable. Able to walk with RW without dizziness. Just tired. To recliner. Moving well. Will f/u. [phone]   Elissa Lovett Partridge CES, ACSM 03/05/2014 9:48 AM
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
## 2014-03-05 NOTE — Discharge Summary (Signed)
Physician Discharge Summary       [address]       [name] [address]             [phone]    Patient ID: [name] [name] MRN: [id number] DOB/AGE: 06-10-1965 49 y.o.  Admit date: 03/01/2014 Discharge date: 03/07/2014  Admission Diagnoses: 1. Severe single vessel CAD (s/p PCI failure) 2. Acute, evolving MI 3. History of hypertension 4. History of gout  Discharge Diagnoses:  1. Severe single vessel CAD (s/p PCI failure) 2. Acute, evolving MI 3. History of hypertension 4. History of gout 5. ABL anemia 6. Thrombocytopenia  Procedure (s):  1. Cardiac catheterization done by Dr. Katrinka Blazing on 03/02/2014: ANGIOGRAPHIC DATA: The left main coronary artery is widely patent.  The left anterior descending artery is widely patent. The LAD reaches the left ventricular apex. One large diagonal arises from the mid vessel. No significant obstructions noted in the LAD.Marland Kitchen  The left circumflex artery is is large and widely patent. There is a very tortuous proximal segment. 2 distal obtuse marginals arise from the vessel and all widely patent.  A moderate size ramus intermedius branch is widely patent..  The right coronary artery is highly tortuous. There are 290 bends followed by a third 90 bend proximal to the 95% stenosis in the mid RCA beyond the origin of an acute marginal branch. The vessel beyond the high-grade obstruction is again tortuous and terminates on a large PDA.Marland Kitchen   PCI RESULTS: Failed PCI with abrupt occlusion of the right coronary due to intimal dissection. Procedure failed because of tortuosity, poor guide support, and inability to cross the distal stenosis with a guidewire.  LEFT VENTRICULOGRAM: Left ventricular angiogram was done in the 30 RAO projection and revealed a vigorous anterior wall. Overall LV contractility is poorly visualized. Unable to estimate ejection fraction. Echocardiography performed on 03/01/2013 demonstrated "normal  EF".   IMPRESSIONS: 1. High-grade obstruction in the mid right coronary beyond several regions of tortuosity. 2. 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 Vessel tortuosity and poor guide support contributed to the poor outcome. 3. Widely patent left coronary system 4. Known normal left ventricular systolic function   RECOMMENDATION: Emergency coronary bypass grafting per Dr. Tressie Stalker, TCTS. Long discussion with the patient and family prior to surgery. Images were reviewed with the patient's family.     2. Emergency Coronary Artery Bypass Grafting x 1 Saphenous Vein Graft to Distal Right Coronary Artery Endoscopic Vein Harvest from Right Thigh by Dr. Cornelius Moras on 03/02/2014.  History of Presenting Illness: This is a 49 year old white male with history of hypertension and a family history of premature coronary artery disease who was admitted to the hospital 03/01/2014 with accelerating symptoms of chest pain suspicious for angina pectoris. The patient reportedly has been experiencing exertional shortness of breath and chest discomfort for several months. The patient was evaluated by a cardiologist in [place] who performed a stress echocardiogram on 03/01/2014. Official report of the patient's stress echocardiogram are not available presently. During the procedure the patient reportedly developed symptoms of nausea, diaphoresis during his exercise treadmill test, and he developed chest pain after he returned home. He presented to the emergency department where EKGs revealed no acute ST changes and initial set of cardiac enzymes were negative. The patient subtotally ruled out for acute myocardial infarction. Because of the suspicious nature of his symptoms, he was taken for elective diagnostic catheterization this afternoon by Dr. Katrinka Blazing. The patient  was found to have high-grade stenosis of the proximal right  coronary artery with otherwise normal coronary anatomy and normal left ventricular systolic function. PCI and stenting of the right coronary artery was attempted, but prior to securing a guidewire across the lesion the patient developed acute dissection of the right coronary artery with complete occlusion. Numerous attempts to cross the vessel with a guidewire were unsuccessful. Emergent cardiothoracic surgical consultation was requested. Upon arrival to the cath lab the patient is awake and alert complaining of ongoing chest pain. The patient remains hemodynamically stable in sinus rhythm with normal blood pressure. The patient received 1 oral dose of Brilinta immediately prior to attempts at performing PCI. Dr. Cornelius Moras discussed the need for emergent coronary artery bypass grafting surgery. Potential risks, benefits, and complications were discussed with the patient and he agreed to proceed with surgery. He underwent an emergent CABG x 1 on 03/02/2014.  Brief Hospital Course:  The patient was extubated the evening of surgery without difficulty. He remained afebrile and hemodynamically stable. He was weaned off Neo synephrine drip. [name] [name], a line, chest tubes, and foley were removed early in the post operative course. Lopressor was started and titrated accordingly. He was volume over loaded and diuresed. He did have ABL anemia. His H and H went down to 7.4 and 20.8 on post operative day one. He was given 2 units of PRBC. He was then started on Iron and folic acid. His H and H decreased again to 7.1 and 20.5 and he was symptomatic. He was transfused again. His H and H was then up to 8.1/23.7.He also had thrombocytopenia. His last platelet count was 107,000. He was weaned off the insulin drip. The patient's HGA1C pre op was 5.7. He will need further surveillance by medical doctor after discharge. The patient was felt surgically stable for transfer from the ICU to [facility] for further convalescence on 03/04/2012. He   continues to progress with cardiac rehab. He  was ambulating on room air. He has been tolerating a diet and has had a bowel movement. Epicardial pacing wires and chest tube sutures will be removed prior to discharge.  Patient developed bloody drainage from top of sternotomy incision.  The area was clean and showed no evidence of infection.  This has been slowly improving.  Provided the patient remains afebrile, hemodynamically stable, and pending morning round evaluation, he will be surgically stable for discharge on 03/07/2014.   Latest Vital Signs: Blood pressure 117/66, pulse 90, temperature 99.3 F (37.4 C), temperature source Oral, resp. rate 18, height 6\' 7"  (2.007 m), weight 304 lb 3.2 oz (137.984 kg), SpO2 100 %.  Physical Exam: Cardiovascular: RRR, no murmur Pulmonary: Clear to auscultation bilaterally; no rales, wheezes, or rhonchi. Abdomen: Soft, non tender, bowel sounds present. Extremities: Mild bilateral lower extremity edema. Wounds: RLE wound is clean and dry. Aquacel dressing is in place  Discharge Condition:Stable  Recent laboratory studies:  Lab Results  Component Value Date   WBC 9.4 03/05/2014   HGB 7.1* 03/05/2014   HCT 20.5* 03/05/2014   MCV 93.2 03/05/2014   PLT 107* 03/05/2014   Lab Results  Component Value Date   NA 136 03/05/2014   K 4.1 03/05/2014   CL 106 03/05/2014   CO2 25 03/05/2014   CREATININE 1.09 03/05/2014   GLUCOSE 114* 03/05/2014    Diagnostic Studies: Dg Chest 2 View  03/05/2014   CLINICAL DATA:  Post CABG.  Atelectasis.  EXAM: CHEST  2 VIEW  COMPARISON:  03/04/2014  FINDINGS: Mediastinal drain has been removed.  No pneumothorax  Improved aeration in the left lung base with mild residual atelectasis and effusion. Mild right lower lobe atelectasis and effusion also present. Negative for heart failure.  IMPRESSION: Improved aeration in the left lower lobe. Mild bibasilar atelectasis and effusion remain.   Electronically Signed   By: Marlan Palau M.D.   On: 03/05/2014 07:49    Discharge Medications:  The patient has been discharged on:   1.Beta Blocker:  Yes [ x  ]                              No   [   ]                              If No, reason:  2.Ace Inhibitor/ARB: Yes [x   ]                                     No  [    ]                                     If No, reason:  3.Statin:   Yes [ x  ]                  No  [   ]                  If No, reason:  4.Ecasa:  Yes  [ x  ]                  No   [   ]                  If No, reason:    Medication List    TAKE these medications        allopurinol 300 MG tablet  Commonly known as:  ZYLOPRIM  Take 600 mg by mouth daily.     aspirin 325 MG EC tablet  Take 1 tablet (325 mg total) by mouth daily.     atorvastatin 10 MG tablet  Commonly known as:  LIPITOR  Take 1 tablet (10 mg total) by mouth daily at 6 PM.     folic acid 1 MG tablet  Commonly known as:  FOLVITE  Take 1 tablet (1 mg total) by mouth daily.     iron polysaccharides 150 MG capsule  Commonly known as:  NIFEREX  Take 1 capsule (150 mg total) by mouth daily.     lisinopril 5 MG tablet  Commonly known as:  PRINIVIL,ZESTRIL  Take 5 mg by mouth daily.     metoprolol tartrate 25 MG tablet  Commonly known as:  LOPRESSOR  Take 1 tablet (25 mg total) by mouth 2 (two) times daily.     morphine 30 MG tablet  Commonly known as:  MSIR  Take 30 mg by mouth every 6 (six) hours as needed for severe pain.     naproxen sodium 220 MG tablet  Commonly known as:  ANAPROX  Take 220 mg by mouth 2 (two) times daily with a meal. Take every day per patient  ondansetron 4 MG tablet  Commonly known as:  ZOFRAN  Take 1 tablet (4 mg total) by mouth every 6 (six) hours.         Follow Up Appointments: Follow-up Information    Follow up with Tereso Newcomer, PA-C On 03/21/2014.   Specialty:  Physician Assistant   Why:  Appointment time is at 11:30 am   Contact information:   [address] [place] [address] [phone]       Follow up with Purcell Nails, MD.   Specialty:  Cardiothoracic Surgery   Why:  PA/LAT CXR to be taken (at [facility] which is in the same building as Dr. Orvan July office) on 04/02/2014 at 11:00 am;Appointment with Dr. Cornelius Moras is at 12:00 pm   Contact information:   [address] [employer] [address] [place] [address] [phone]       Signed: Doree Fudge MPA-C 03/05/2014, 12:05 PM

## 2014-03-05 NOTE — Progress Notes (Signed)
Utilization review completed.  

## 2014-03-05 NOTE — Progress Notes (Addendum)
      [address]       [place] [address]             [phone]        3 Days Post-Op Procedure(s) (LRB): CORONARY ARTERY BYPASS GRAFTING (CABG), ON PUMP, TIMES ONE, RIGHT GREATER SAPHENOUS VEIN HARVESTED ENDOSCOPICALLY (N/A)  Subjective: Patient with some incisional pain and fatigue.  Objective: Vital signs in last 24 hours: Temp:  [98.5 F (36.9 C)-100.2 F (37.9 C)] 99.3 F (37.4 C) (02/01 0443) Pulse Rate:  [79-94] 83 (02/01 0443) Cardiac Rhythm:  [-] Normal sinus rhythm (01/31 2000) Resp:  [13-26] 18 (02/01 0443) BP: (96-130)/(51-72) 117/61 mmHg (02/01 0443) SpO2:  [92 %-99 %] 95 % (02/01 0443) Weight:  [304 lb 3.2 oz (137.984 kg)] 304 lb 3.2 oz (137.984 kg) (02/01 0500)  Pre op weight 135 kg Current Weight  03/05/14 304 lb 3.2 oz (137.984 kg)      Intake/Output from previous day: 01/31 0701 - 02/01 0700 In: 420 [P.O.:420] Out: 975 [Urine:875; Chest Tube:100]   Physical Exam:  Cardiovascular: RRR, no murmur Pulmonary: Clear to auscultation bilaterally; no rales, wheezes, or rhonchi. Abdomen: Soft, non tender, bowel sounds present. Extremities: Mild bilateral lower extremity edema. Wounds: RLE wound is clean and dry.  Aquacel dressing is in place  Lab Results: CBC: Recent Labs  03/04/14 0215 03/05/14 0538  WBC 12.7* 9.4  HGB 8.1* 7.1*  HCT 23.5* 20.5*  PLT 145* PENDING   BMET:  Recent Labs  03/04/14 0215 03/05/14 0538  NA 136 136  K 4.1 4.1  CL 107 106  CO2 23 25  GLUCOSE 113* 114*  BUN 19 22  CREATININE 1.05 1.09  CALCIUM 8.0* 8.1*    PT/INR:  Lab Results  Component Value Date   INR 1.47 03/02/2014   INR 0.99 03/02/2014   ABG:  INR: Will add last result for INR, ABG once components are confirmed Will add last 4 CBG results once components are confirmed  Assessment/Plan:  1. CV - SR in the 80's. On Lopressor 12.5 bid. Start low dose ACE in am if BP improved. 2.  Pulmonary - On room air this am.CXR this am  shows mild bibasilar atelectasis, no pneumothorax, small left pleural effusion.Encourage incentive spirometer 3. Volume Overload - On Lasix 40 bid 4.  Acute blood loss anemia - H and H down to 7.1 and 20.5. Previously transfused 2 units on post op day.On Folic acid and Iron 5. CBGs 98/90/103.  Await HGA1C. 6. Thrombocytopenia-platelets down to 107,000. Not on heparin products 7. Temp to 100.2 likely combination of SIRS and atelectasis. WBC normal at 9400. 8. Possible discharge 1-2 days  ZIMMERMAN,DONIELLE MPA-C 03/05/2014,7:55 AM  I have seen and examined the patient and agree with the assessment and plan as outlined.  Patient feels dizzy while sitting up.  Will transfuse for symptomatic anemia.  OWEN,CLARENCE H 03/05/2014 9:09 AM

## 2014-03-06 LAB — TYPE AND SCREEN
ABO/RH(D): O POS
ANTIBODY SCREEN: NEGATIVE
UNIT DIVISION: 0
Unit division: 0
Unit division: 0
Unit division: 0

## 2014-03-06 LAB — BASIC METABOLIC PANEL
Anion gap: 7 (ref 5–15)
BUN: 18 mg/dL (ref 6–23)
CHLORIDE: 104 mmol/L (ref 96–112)
CO2: 26 mmol/L (ref 19–32)
Calcium: 8.1 mg/dL — ABNORMAL LOW (ref 8.4–10.5)
Creatinine, Ser: 1.04 mg/dL (ref 0.50–1.35)
GFR calc Af Amer: >90 mL/min (ref >90)
GFR calc non Af Amer: 83 mL/min — ABNORMAL LOW (ref >90)
GLUCOSE: 112 mg/dL — AB (ref 70–99)
POTASSIUM: 4.1 mmol/L (ref 3.5–5.1)
Sodium: 137 mmol/L (ref 135–145)

## 2014-03-06 LAB — CBC
HCT: 23.7 % — ABNORMAL LOW (ref 39.0–52.0)
Hemoglobin: 8.1 g/dL — ABNORMAL LOW (ref 13.0–17.0)
MCH: 31.3 pg (ref 26.0–34.0)
MCHC: 34.2 g/dL (ref 30.0–36.0)
MCV: 91.5 fL (ref 78.0–100.0)
Platelets: 122 10*3/uL — ABNORMAL LOW (ref 150–400)
RBC: 2.59 MIL/uL — ABNORMAL LOW (ref 4.22–5.81)
RDW: 16.5 % — AB (ref 11.5–15.5)
WBC: 7.6 10*3/uL (ref 4.0–10.5)

## 2014-03-06 MED ORDER — LISINOPRIL 5 MG PO TABS
5.0000 mg | ORAL_TABLET | Freq: Every day | ORAL | Status: DC
  Administered 2014-03-06 – 2014-03-07 (×2): 5 mg via ORAL
  Filled 2014-03-06 (×2): qty 1

## 2014-03-06 MED ORDER — METOPROLOL TARTRATE 25 MG PO TABS
25.0000 mg | ORAL_TABLET | Freq: Two times a day (BID) | ORAL | Status: DC
  Administered 2014-03-06 – 2014-03-07 (×3): 25 mg via ORAL
  Filled 2014-03-06 (×4): qty 1

## 2014-03-06 NOTE — Progress Notes (Signed)
03/06/2014 3:12 PM  EPW D/C'd per orders and protocol.  Wires intact.  Instructed pt. Bedrest x 1hour and VS q68minutes.   Pt. Tolerated well and voices understanding of instructions. [name] [name]

## 2014-03-06 NOTE — Progress Notes (Signed)
CARDIAC REHAB PHASE I   PRE:  Rate/Rhythm: 66 SR    BP: sitting 100/56    SaO2: 99 RA  MODE:  Ambulation: 550 ft   POST:  Rate/Rhythm: 89 SR    BP: sitting 120/60     SaO2: 100 RA  Able to get OOB independently, walked without RW. Steady. Sts he feels fatigued toward end of walk, some SOB. VSS. Return to bed to pull EPW. Will ed in am [id number]   Harriet Masson CES, ACSM 03/06/2014 2:41 PM

## 2014-03-06 NOTE — Progress Notes (Signed)
Changed dressing on upper portion of sternal incision. Pt stated that he was having oozing of bright red blood from upper portion of incision when he coughs. Two 4x4 gauze dressings were noted to be saturated. Patient stated that the dressing had been changed on day shift as well due to drainage. Replaced dressing and advised pt to splint incision when coughing. Will continue to monitor drainage.
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
## 2014-03-06 NOTE — Progress Notes (Addendum)
       [address]       [place] [address]             [phone]          4 Days Post-Op Procedure(s) (LRB): CORONARY ARTERY BYPASS GRAFTING (CABG), ON PUMP, TIMES ONE, RIGHT GREATER SAPHENOUS VEIN HARVESTED ENDOSCOPICALLY (N/A)  Subjective: Feels much better today after transfusion yesterday. No more dizziness.  Appetite good, breathing stable.  +several BMs yesterday.   Objective: Vital signs in last 24 hours: Patient Vitals for the past 24 hrs:  BP Temp Temp src Pulse Resp SpO2 Height Weight  03/06/14 0412 120/60 mmHg 99.4 F (37.4 C) Oral 76 16 97 % - -  03/06/14 0300 - - - - - - 6\' 7"  (2.007 m) (!) 303 lb 8 oz (137.667 kg)  03/06/14 0041 (!) 107/59 mmHg - - 78 16 97 % - -  03/05/14 1945 107/63 mmHg 98.2 F (36.8 C) Oral 75 16 97 % - -  03/05/14 1723 (!) 108/59 mmHg 99.9 F (37.7 C) Oral 83 18 97 % - -  03/05/14 1704 125/61 mmHg 98.4 F (36.9 C) Oral 80 18 95 % - -  03/05/14 1628 117/67 mmHg 99.8 F (37.7 C) Oral 79 16 97 % - -  03/05/14 1322 109/61 mmHg 99.4 F (37.4 C) Oral 85 18 100 % - -  03/05/14 1238 103/61 mmHg 99.7 F (37.6 C) Oral 82 18 99 % - -  03/05/14 1051 117/66 mmHg - - 90 - 100 % - -  03/05/14 0958 (!) 114/58 mmHg - - 83 18 98 % - -   Current Weight  03/06/14 303 lb 8 oz (137.667 kg)  BASELINE WEIGHT:135 kg   Intake/Output from previous day: 02/01 0701 - 02/02 0700 In: 1245 [P.O.:600; I.V.:250; Blood:395] Out: 550 [Urine:550]    PHYSICAL EXAM:  Heart: RRR Lungs: Clear Wound: Intact, +sanguinous oozing from sternal incision, sternum stable Extremities: Minimal LE edema    Lab Results: CBC: Recent Labs  03/05/14 0538 03/06/14 0435  WBC 9.4 7.6  HGB 7.1* 8.1*  HCT 20.5* 23.7*  PLT 107* 122*   BMET:  Recent Labs  03/05/14 0538 03/06/14 0435  NA 136 137  K 4.1 4.1  CL 106 104  CO2 25 26  GLUCOSE 114* 112*  BUN 22 18  CREATININE 1.09 1.04  CALCIUM 8.1* 8.1*    PT/INR: No results for input(s):  LABPROT, INR in the last 72 hours.    Assessment/Plan: S/P Procedure(s) (LRB): CORONARY ARTERY BYPASS GRAFTING (CABG), ON PUMP, TIMES ONE, RIGHT GREATER SAPHENOUS VEIN HARVESTED ENDOSCOPICALLY (N/A)  CV- BPs stable, maintaining SR.  Continue beta blocker, ACE-I.  Expected postop blood loss anemia- H/H improved following transfusion. Continue Fe.  Vol overload- continue diuresis.  Thrombocytopenia- plts improving.   GI- some loose stools today, will hold laxatives and stool softeners and watch.  Hopefully home in am if remains stable.   LOS: 5 days    COLLINS,GINA H 03/06/2014  I have seen and examined the patient and agree with the assessment and plan as outlined.  Feels much better since transfusion.  Tentatively for d/c home tomorrow  Purcell Nails 03/06/2014 10:13 AM

## 2014-03-07 MED ORDER — METOPROLOL TARTRATE 25 MG PO TABS: 25.0000 mg | ORAL_TABLET | Freq: Two times a day (BID) | ORAL | Status: DC

## 2014-03-07 MED ORDER — ASPIRIN 325 MG PO TBEC: 325.0000 mg | DELAYED_RELEASE_TABLET | Freq: Every day | ORAL | Status: DC

## 2014-03-07 MED ORDER — POLYSACCHARIDE IRON COMPLEX 150 MG PO CAPS
150.0000 mg | ORAL_CAPSULE | Freq: Every day | ORAL | Status: DC
Start: 2014-03-07 — End: 2014-04-04

## 2014-03-07 MED ORDER — FOLIC ACID 1 MG PO TABS: 1.0000 mg | ORAL_TABLET | Freq: Every day | ORAL | Status: DC

## 2014-03-07 MED ORDER — ATORVASTATIN CALCIUM 10 MG PO TABS: 10.0000 mg | ORAL_TABLET | Freq: Every day | ORAL | Status: DC

## 2014-03-07 NOTE — Progress Notes (Addendum)
      [address]       [place] [address]             [phone]     5 Days Post-Op Procedure(s) (LRB): CORONARY ARTERY BYPASS GRAFTING (CABG), ON PUMP, TIMES ONE, RIGHT GREATER SAPHENOUS VEIN HARVESTED ENDOSCOPICALLY (N/A)   Subjective:  [name] [name] complains of bleeding from the top of his surgical incision.  He states that this started yesterday.  The dressing has been changed several times, but he states this morning it appears to have decreased.  He is ambulating independently. +BM  Objective: Vital signs in last 24 hours: Temp:  [99 F (37.2 C)-99.6 F (37.6 C)] 99.6 F (37.6 C) (02/03 0426) Pulse Rate:  [66-75] 66 (02/03 0426) Cardiac Rhythm:  [-] Normal sinus rhythm (02/02 2030) Resp:  [18-19] 19 (02/03 0426) BP: (105-121)/(53-65) 115/65 mmHg (02/03 0426) SpO2:  [97 %-100 %] 99 % (02/03 0426) Weight:  [301 lb 5.9 oz (136.7 kg)] 301 lb 5.9 oz (136.7 kg) (02/03 0426)  Intake/Output from previous day: 02/02 0701 - 02/03 0700 In: 480 [P.O.:480] Out: -   General appearance: alert, cooperative and no distress Heart: regular rate and rhythm Lungs: clear to auscultation bilaterally Abdomen: soft, non-tender; bowel sounds normal; no masses,  no organomegaly Extremities: edema trace Wound: clean, some bleeding from superior aspect of sternotomy no evidence of infection Lab Results:  Recent Labs  03/05/14 0538 03/06/14 0435  WBC 9.4 7.6  HGB 7.1* 8.1*  HCT 20.5* 23.7*  PLT 107* 122*   BMET:  Recent Labs  03/05/14 0538 03/06/14 0435  NA 136 137  K 4.1 4.1  CL 106 104  CO2 25 26  GLUCOSE 114* 112*  BUN 22 18  CREATININE 1.09 1.04  CALCIUM 8.1* 8.1*    PT/INR: No results for input(s): LABPROT, INR in the last 72 hours. ABG    Component Value Date/Time   PHART 7.426 03/03/2014 1040   HCO3 20.0 03/03/2014 1040   TCO2 18 03/03/2014 1624   ACIDBASEDEF 3.0* 03/03/2014 1040   O2SAT 99.0 03/03/2014 1040   CBG (last 3)   Recent Labs  03/04/14 1130 03/04/14 1553  GLUCAP 90 103*    Assessment/Plan: S/P Procedure(s) (LRB): CORONARY ARTERY BYPASS GRAFTING (CABG), ON PUMP, TIMES ONE, RIGHT GREATER SAPHENOUS VEIN HARVESTED ENDOSCOPICALLY (N/A)  1. CV- NSR, rate in the 60-70s, pressure controlled- continue Lopressor, Lisinopril 2. Pulm- off oxygen, no acute issues continue IS 3. Renal- creatinine has been WNL, weight is up about 3 lbs since admission, not currently on Lasix 4. Dispo- patient looks great, stable, some minor bleeding from sternotomy incision, no evidence of infection, home today if okay with Dr. Cornelius Moras   LOS: 6 days    [name] [name] 03/07/2014  I have seen and examined the patient and agree with the assessment and plan as outlined.  [name] [name] feels well and wants to go home.  Bloody drainage from superior aspect of sternal incision has decreased.  Wound care instructions given.  Will see him in the office on Monday for wound check.  [name] [name] 03/07/2014 3:11 PM

## 2014-03-07 NOTE — Discharge Instructions (Signed)
Coronary Artery Bypass Grafting, Care After °Refer to this sheet in the next few weeks. These instructions provide you with information on caring for yourself after your procedure. Your health care provider may also give you more specific instructions. Your treatment has been planned according to current medical practices, but problems sometimes occur. Call your health care provider if you have any problems or questions after your procedure. °WHAT TO EXPECT AFTER THE PROCEDURE °Recovery from surgery will be different for everyone. Some people feel well after 3 or 4 weeks, while for others it takes longer. After your procedure, it is typical to have the following: °· Nausea and a lack of appetite.   °· Constipation. °· Weakness and fatigue.   °· Depression or irritability.   °· Pain or discomfort at your incision site. °HOME CARE INSTRUCTIONS °· Take medicines only as directed by your health care provider. Do not stop taking medicines or start any new medicines without first checking with your health care provider. °· Take your pulse as directed by your health care provider. °· Perform deep breathing as directed by your health care provider. If you were given a device called an incentive spirometer, use it to practice deep breathing several times a day. Support your chest with a pillow or your arms when you take deep breaths or cough. °· Keep incision areas clean, dry, and protected. Remove or change any bandages (dressings) only as directed by your health care provider. You may have skin adhesive strips over the incision areas. Do not take the strips off. They will fall off on their own. °· Check incision areas daily for any swelling, redness, or drainage. °· If incisions were made in your legs, do the following: °¨ Avoid crossing your legs.   °¨ Avoid sitting for long periods of time. Change positions every 30 minutes.   °¨ Elevate your legs when you are sitting. °· Wear compression stockings as directed by your  health care provider. These stockings help keep blood clots from forming in your legs. °· Take showers once your health care provider approves. Until then, only take sponge baths. Pat incisions dry. Do not rub incisions with a washcloth or towel. Do not take baths, swim, or use a hot tub until your health care provider approves. °· Eat foods that are high in fiber, such as raw fruits and vegetables, whole grains, beans, and nuts. Meats should be lean cut. Avoid canned, processed, and fried foods. °· Drink enough fluid to keep your urine clear or pale yellow. °· Weigh yourself every day. This helps identify if you are retaining fluid that may make your heart and lungs work harder. °· Rest and limit activity as directed by your health care provider. You may be instructed to: °¨ Stop any activity at once if you have chest pain, shortness of breath, irregular heartbeats, or dizziness. Get help right away if you have any of these symptoms. °¨ Move around frequently for short periods or take short walks as directed by your health care provider. Increase your activities gradually. You may need physical therapy or cardiac rehabilitation to help strengthen your muscles and build your endurance. °¨ Avoid lifting, pushing, or pulling anything heavier than 10 lb (4.5 kg) for at least 6 weeks after surgery. °· Do not drive until your health care provider approves.  °· Ask your health care provider when you may return to work. °· Ask your health care provider when you may resume sexual activity. °· Keep all follow-up visits as directed by your health care   provider. This is important. °SEEK MEDICAL CARE IF: °· You have swelling, redness, increasing pain, or drainage at the site of an incision. °· You have a fever. °· You have swelling in your ankles or legs. °· You have pain in your legs.   °· You gain 2 or more pounds (0.9 kg) a day. °· You are nauseous or vomit. °· You have diarrhea.  °SEEK IMMEDIATE MEDICAL CARE IF: °· You have  chest pain that goes to your jaw or arms. °· You have shortness of breath.   °· You have a fast or irregular heartbeat.   °· You notice a "clicking" in your breastbone (sternum) when you move.   °· You have numbness or weakness in your arms or legs. °· You feel dizzy or light-headed.   °MAKE SURE YOU: °· Understand these instructions. °· Will watch your condition. °· Will get help right away if you are not doing well or get worse. °Document Released: 08/08/2004 Document Revised: 06/05/2013 Document Reviewed: 06/28/2012 °[employer]® Patient Information ©2015 [employer]. This information is not intended to replace advice given to you by your health care provider. Make sure you discuss any questions you have with your health care provider. ° °Endoscopic Saphenous Vein Harvesting °Care After °Refer to this sheet in the next few weeks. These instructions provide you with information on caring for yourself after your procedure. Your health care provider may also give you more specific instructions. Your treatment has been planned according to current medical practices, but problems sometimes occur. Call your health care provider if you have any problems or questions after your procedure. °HOME CARE INSTRUCTIONS °Medicine °· Take whatever pain medicine your surgeon prescribes. Follow the directions carefully. Do not take over-the-counter pain medicine unless your surgeon says it is okay. Some pain medicine can cause bleeding problems for several weeks after surgery. °· Follow your surgeon's instructions about driving. You will probably not be permitted to drive after heart surgery. °· Take any medicines your surgeon prescribes. Any medicines you took before your heart surgery should be checked with your health care provider before you start taking them again. °Wound care °· If your surgeon has prescribed an elastic bandage or stocking, ask how long you should wear it. °· Check the area around your surgical cuts  (incisions) whenever your bandages (dressings) are changed. Look for any redness or swelling. °· You will need to return to have the stitches (sutures) or staples taken out. Ask your surgeon when to do that. °· Ask your surgeon when you can shower or bathe. °Activity °· Try to keep your legs raised when you are sitting. °· Do any exercises your health care providers have given you. These may include deep breathing exercises, coughing, walking, or other exercises. °SEEK MEDICAL CARE IF: °· You have any questions about your medicines. °· You have more leg pain, especially if your pain medicine stops working. °· New or growing bruises develop on your leg. °· Your leg swells, feels tight, or becomes red. °· You have numbness in your leg. °SEEK IMMEDIATE MEDICAL CARE IF: °· Your pain gets much worse. °· Blood or fluid leaks from any of the incisions. °· Your incisions become warm, swollen, or red. °· You have chest pain. °· You have trouble breathing. °· You have a fever. °· You have more pain near your leg incision. °MAKE SURE YOU: °· Understand these instructions. °· Will watch your condition. °· Will get help right away if you are not doing well or get worse. °Document Released: 10/01/2010   Document Revised: 01/24/2013 Document Reviewed: 10/01/2010 °Exit[employer]® Patient Information ©2015 [employer]. This information is not intended to replace advice given to you by your health care provider. Make sure you discuss any questions you have with your health care provider. ° ° °

## 2014-03-07 NOTE — Progress Notes (Signed)
CARDIAC REHAB PHASE I   PRE:  Rate/Rhythm: 69 SR  BP:  Supine: 114/63  Sitting:   Standing:    SaO2: 95%RA  MODE:  Ambulation: 840 ft   POST:  Rate/Rhythm: 79 SR  BP:  Supine:   Sitting: 140/80  Standing:    SaO2: 100%RA 1015-1125 Pt walked 840 ft with steady gait with minimal asst. Stopped a couple of times to get his breath. To recliner after walk. Education completed with pt( and wife was there for part of it). She got phone call and had to leave room, pt voiced understanding and could answer per teachback. Wrote down how to view post op video. Discussed with pt CRP 2 and permission given to refer to [place]. Pt's HGA1C at 5.7, encouraged pt to watch carbs and discussed heart healthy diet. Pt stated his brother had diabetes and passed with cancer.   Luetta Nutting, RN BSN  03/07/2014 11:21 AM

## 2014-03-07 NOTE — Progress Notes (Signed)
03/07/2014 6:13 PM Discharge AVS meds taken today and those due this evening reviewed.  Specific post CABG instructions given.  Follow-up appointments and when to call md reviewed.  D/C IV and TELE.  Questions and concerns addressed.   D/C home per orders. Kathryne Hitch

## 2014-03-07 NOTE — Progress Notes (Signed)
03/07/2014 12:04 PM Chest tube sutures D/C'd per orders.  Steri strips applied.  Pt. Tolerated well. [name] [name]

## 2014-03-07 NOTE — Progress Notes (Signed)
Changed dressing on upper portion of midsternal incision again. Incision continues to ooze bright red drainage.

## 2014-03-12 ENCOUNTER — Ambulatory Visit (INDEPENDENT_AMBULATORY_CARE_PROVIDER_SITE_OTHER): Payer: Self-pay | Admitting: Physician Assistant

## 2014-03-12 VITALS — BP 115/73 | HR 66 | Resp 16 | Ht 79.0 in | Wt 298.0 lb

## 2014-03-12 DIAGNOSIS — Z951 Presence of aortocoronary bypass graft: Secondary | ICD-10-CM

## 2014-03-12 DIAGNOSIS — I2510 Atherosclerotic heart disease of native coronary artery without angina pectoris: Secondary | ICD-10-CM

## 2014-03-12 NOTE — Progress Notes (Signed)
       [address]       [name] [address]             [phone]          HPI: Patient returns for a postoperative wound check.  He underwent emergency CABG x 1 by Dr. Cornelius Moras on 03/02/2014 for an acute MI and RCA dissection.  Prior to surgery, the patient received Brilinta in the cath lab and had some persistent minor bloody oozing from the upper part of the sternal incision during his hospital stay.  By the time he was discharged, this had improved significantly and showed no signs of infection.  He was asked to return to the office today for a recheck.  He has done well since his discharge.  The drainage stopped completely a few days after going home and he has had no further drainage since then.  He also denies fever, chills, or erythema.  He is ambulating around the house with no dyspnea and only minor chest discomfort.  Appetite is good and lower extremity edema is improved.    Current Outpatient Prescriptions  Medication Sig Dispense Refill  . allopurinol (ZYLOPRIM) 300 MG tablet Take 600 mg by mouth daily.    Marland Kitchen aspirin EC 325 MG EC tablet Take 1 tablet (325 mg total) by mouth daily. 30 tablet 0  . atorvastatin (LIPITOR) 10 MG tablet Take 1 tablet (10 mg total) by mouth daily at 6 PM. 30 tablet 3  . folic acid (FOLVITE) 1 MG tablet Take 1 tablet (1 mg total) by mouth daily. 30 tablet 0  . iron polysaccharides (NIFEREX) 150 MG capsule Take 1 capsule (150 mg total) by mouth daily. 30 capsule 0  . lisinopril (PRINIVIL,ZESTRIL) 5 MG tablet Take 5 mg by mouth daily.    . metoprolol tartrate (LOPRESSOR) 25 MG tablet Take 1 tablet (25 mg total) by mouth 2 (two) times daily. 60 tablet 3  . morphine (MSIR) 30 MG tablet Take 30 mg by mouth every 6 (six) hours as needed for severe pain.    . naproxen sodium (ANAPROX) 220 MG tablet Take 220 mg by mouth 2 (two) times daily with a meal. Take every day per patient    . ondansetron (ZOFRAN) 4 MG tablet Take 1 tablet (4 mg total)  by mouth every 6 (six) hours. 12 tablet 0   No current facility-administered medications for this visit.     Physical Exam: BP 115/73 HR 66 Resp 16 Wounds: Sternal, chest tube and leg wounds are intact and dry. There is a small area of ecchymosis at the upper portion of the sternum, but this is not raised or fluctuant.  No drainage can be expressed.  Sternum is stable. Heart: regular rate and rhythm Lungs: Clear to auscultation Extremities: Mild LE edema   Diagnostic Tests: Chest xray: No results found.     Assessment/Plan: The patient is doing well status post emergency CABG.  The drainage from his sternal wound has resolved, and there is no evidence of infection.  He will see Tereso Newcomer, PA-C next week for cardiology follow up, and Dr. Cornelius Moras the following week.  He is instructed to call in the interim if he develops further drainage, erythema, fevers, chills or other symptoms prior to that visit.

## 2014-03-21 ENCOUNTER — Ambulatory Visit (INDEPENDENT_AMBULATORY_CARE_PROVIDER_SITE_OTHER): Payer: 59 | Admitting: Physician Assistant

## 2014-03-21 ENCOUNTER — Encounter: Payer: Self-pay | Admitting: Physician Assistant

## 2014-03-21 VITALS — BP 100/70 | HR 62 | Ht 79.0 in | Wt 287.0 lb

## 2014-03-21 DIAGNOSIS — D649 Anemia, unspecified: Secondary | ICD-10-CM

## 2014-03-21 DIAGNOSIS — I10 Essential (primary) hypertension: Secondary | ICD-10-CM

## 2014-03-21 DIAGNOSIS — E785 Hyperlipidemia, unspecified: Secondary | ICD-10-CM

## 2014-03-21 DIAGNOSIS — I2510 Atherosclerotic heart disease of native coronary artery without angina pectoris: Secondary | ICD-10-CM

## 2014-03-21 MED ORDER — LISINOPRIL 2.5 MG PO TABS: 2.5000 mg | ORAL_TABLET | Freq: Every day | ORAL | Status: DC

## 2014-03-21 NOTE — Patient Instructions (Signed)
FOLLOW UP WITH DR. Delton See IN 6 WEEKS  FASTING LIPID AND LIVER PANEL TO BE DONE IN 6 WEEKS  DECREASE LISINOPRIL TO 2.5 MG 1 TABLET DAILY; NEW RX SENT IN FOR THE 2.5 MG TABLET  Your physician has requested that you have an echocardiogram. Echocardiography is a painless test that uses sound waves to create images of your heart. It provides your doctor with information about the size and shape of your heart and how well your heart's chambers and valves are working. This procedure takes approximately one hour. There are no restrictions for this procedure.
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
## 2014-03-22 ENCOUNTER — Telehealth: Payer: Self-pay | Admitting: Physician Assistant

## 2014-03-22 NOTE — Telephone Encounter (Signed)
New Msg         Pt calling, states at appt yesterday he was told to begin Cardiac Rehab.  Where will he go for this? Pt needs info to give to Insurance carrier.  Please call and advise.

## 2014-03-22 NOTE — Telephone Encounter (Signed)
I returned pt's call about cardiac rehab. I explained this will be done at [facility] out pt. Pt said ok and thank you.

## 2014-03-23 ENCOUNTER — Ambulatory Visit (HOSPITAL_COMMUNITY): Payer: 59 | Attending: Cardiovascular Disease | Admitting: Radiology

## 2014-03-23 DIAGNOSIS — E785 Hyperlipidemia, unspecified: Secondary | ICD-10-CM | POA: Diagnosis not present

## 2014-03-23 DIAGNOSIS — I2510 Atherosclerotic heart disease of native coronary artery without angina pectoris: Secondary | ICD-10-CM

## 2014-03-23 DIAGNOSIS — I10 Essential (primary) hypertension: Secondary | ICD-10-CM | POA: Diagnosis not present

## 2014-03-23 DIAGNOSIS — Z951 Presence of aortocoronary bypass graft: Secondary | ICD-10-CM | POA: Diagnosis not present

## 2014-03-23 NOTE — Progress Notes (Signed)
Echocardiogram performed.  

## 2014-03-26 ENCOUNTER — Encounter: Payer: Self-pay | Admitting: Physician Assistant

## 2014-03-27 ENCOUNTER — Telehealth: Payer: Self-pay | Admitting: *Deleted

## 2014-03-27 NOTE — Telephone Encounter (Signed)
pt notified about echo results with verbal understanding 

## 2014-03-29 ENCOUNTER — Other Ambulatory Visit: Payer: Self-pay | Admitting: Thoracic Surgery (Cardiothoracic Vascular Surgery)

## 2014-03-29 DIAGNOSIS — I219 Acute myocardial infarction, unspecified: Secondary | ICD-10-CM

## 2014-04-02 ENCOUNTER — Ambulatory Visit: Payer: 59 | Admitting: Thoracic Surgery (Cardiothoracic Vascular Surgery)

## 2014-04-02 ENCOUNTER — Ambulatory Visit (INDEPENDENT_AMBULATORY_CARE_PROVIDER_SITE_OTHER): Payer: Self-pay | Admitting: Physician Assistant

## 2014-04-02 ENCOUNTER — Ambulatory Visit
Admission: RE | Admit: 2014-04-02 | Discharge: 2014-04-02 | Disposition: A | Discharge status: Home / Self Care | Payer: 59 | Source: Ambulatory Visit | Attending: Thoracic Surgery (Cardiothoracic Vascular Surgery) | Admitting: Thoracic Surgery (Cardiothoracic Vascular Surgery)

## 2014-04-02 VITALS — BP 118/79 | HR 57 | Resp 16 | Ht 79.0 in | Wt 282.0 lb

## 2014-04-02 DIAGNOSIS — I219 Acute myocardial infarction, unspecified: Secondary | ICD-10-CM

## 2014-04-02 DIAGNOSIS — I25110 Atherosclerotic heart disease of native coronary artery with unstable angina pectoris: Secondary | ICD-10-CM

## 2014-04-02 DIAGNOSIS — Z951 Presence of aortocoronary bypass graft: Secondary | ICD-10-CM
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
## 2014-04-04 ENCOUNTER — Other Ambulatory Visit: Payer: Self-pay | Admitting: *Deleted

## 2014-04-04 ENCOUNTER — Telehealth: Payer: Self-pay | Admitting: *Deleted

## 2014-04-04 MED ORDER — POLYSACCHARIDE IRON COMPLEX 150 MG PO CAPS: 150.0000 mg | ORAL_CAPSULE | Freq: Every day | ORAL | Status: DC

## 2014-04-04 NOTE — Telephone Encounter (Signed)
Patient is wanting to know if he is to continue taking the folvite. Please advise. Thanks, MI

## 2014-04-04 NOTE — Telephone Encounter (Signed)
Would not be able to advise on the pts medication folvite, for Dr Delton See did not order this medication, PA-C Erin Barrett did.  Please follow-up with the pt in regards to this.

## 2014-04-05 ENCOUNTER — Encounter (HOSPITAL_COMMUNITY)
Admission: RE | Admit: 2014-04-05 | Discharge: 2014-04-05 | Disposition: A | Discharge status: Home / Self Care | Payer: 59 | Source: Ambulatory Visit | Attending: Interventional Cardiology | Admitting: Interventional Cardiology

## 2014-04-05 DIAGNOSIS — M109 Gout, unspecified: Secondary | ICD-10-CM | POA: Insufficient documentation

## 2014-04-05 DIAGNOSIS — Z8249 Family history of ischemic heart disease and other diseases of the circulatory system: Secondary | ICD-10-CM | POA: Insufficient documentation

## 2014-04-05 DIAGNOSIS — Z5189 Encounter for other specified aftercare: Secondary | ICD-10-CM | POA: Insufficient documentation

## 2014-04-05 DIAGNOSIS — I252 Old myocardial infarction: Secondary | ICD-10-CM | POA: Insufficient documentation

## 2014-04-05 DIAGNOSIS — I25110 Atherosclerotic heart disease of native coronary artery with unstable angina pectoris: Secondary | ICD-10-CM | POA: Insufficient documentation

## 2014-04-05 DIAGNOSIS — R739 Hyperglycemia, unspecified: Secondary | ICD-10-CM | POA: Insufficient documentation

## 2014-04-05 DIAGNOSIS — Z951 Presence of aortocoronary bypass graft: Secondary | ICD-10-CM | POA: Insufficient documentation

## 2014-04-05 DIAGNOSIS — E785 Hyperlipidemia, unspecified: Secondary | ICD-10-CM | POA: Insufficient documentation

## 2014-04-05 DIAGNOSIS — I10 Essential (primary) hypertension: Secondary | ICD-10-CM | POA: Insufficient documentation

## 2014-04-05 NOTE — Progress Notes (Signed)
Cardiac Rehab Medication Review by a Pharmacist  Does the patient  feel that his/her medications are working for him/her?  yes  Has the patient been experiencing any side effects to the medications prescribed?  yes  Does the patient measure his/her own blood pressure or blood glucose at home?  no   Does the patient have any problems obtaining medications due to transportation or finances?   no  Understanding of regimen: excellent Understanding of indications: good Potential of compliance: good  Pharmacist comments: [name] [name] is a pleasant 76 YOM presenting to cardiac rehab.  He appears compliant with his medications and aware of why he is on them.  He reports some episodes of orthostatic hypotension.  In addition, is on PO iron therapy which subsequently causes constipation.  He reports being consistently tired after 3 pm, likely 2/2 to anemia.  I encouraged him to f/u with his PCP/cardiologist to ensure that anemia is addressed.    [name], Pharm. D. Clinical Pharmacy Resident Pager: [phone] Ph: [phone] 04/05/2014 8:50 AM

## 2014-04-06 NOTE — Telephone Encounter (Signed)
Spoke with patient and he will call Dr Barry Dienes office for their advise on the medication.

## 2014-04-09 ENCOUNTER — Encounter (HOSPITAL_COMMUNITY)
Admission: RE | Admit: 2014-04-09 | Discharge: 2014-04-09 | Disposition: A | Discharge status: Home / Self Care | Payer: 59 | Source: Ambulatory Visit | Attending: Interventional Cardiology | Admitting: Interventional Cardiology

## 2014-04-09 DIAGNOSIS — Z8249 Family history of ischemic heart disease and other diseases of the circulatory system: Secondary | ICD-10-CM | POA: Diagnosis not present

## 2014-04-09 DIAGNOSIS — I252 Old myocardial infarction: Secondary | ICD-10-CM | POA: Diagnosis not present

## 2014-04-09 DIAGNOSIS — M109 Gout, unspecified: Secondary | ICD-10-CM | POA: Diagnosis not present

## 2014-04-09 DIAGNOSIS — E785 Hyperlipidemia, unspecified: Secondary | ICD-10-CM | POA: Diagnosis not present

## 2014-04-09 DIAGNOSIS — R739 Hyperglycemia, unspecified: Secondary | ICD-10-CM | POA: Diagnosis not present

## 2014-04-09 DIAGNOSIS — I10 Essential (primary) hypertension: Secondary | ICD-10-CM | POA: Diagnosis not present

## 2014-04-09 DIAGNOSIS — Z951 Presence of aortocoronary bypass graft: Secondary | ICD-10-CM | POA: Diagnosis not present

## 2014-04-09 DIAGNOSIS — I25110 Atherosclerotic heart disease of native coronary artery with unstable angina pectoris: Secondary | ICD-10-CM | POA: Diagnosis not present

## 2014-04-09 DIAGNOSIS — Z5189 Encounter for other specified aftercare: Secondary | ICD-10-CM | POA: Diagnosis present

## 2014-04-09 NOTE — Progress Notes (Addendum)
11:15 - Pt in today for his first day of exercise at the cardiac rehab phase II program. Pt tolerated exercise with no complaint.  Monitor showed Sr with inverted t wave similar to his most recent 12 lead ekg.  Medication list reconciled. Pt verbalizes compliance to his medications and presently has no barriers to his medications.  Short term goal to increase strength.  Pt feels due the surgery and recovery he feels weaker than he did prior to his cardiac surgery.  Pt was not an exerciser before but does not feel as "strong" as he once has.  He is hopeful that attending the rehab program will help toward this goal.  He is walking 30 minutes twice a day at home with no complaints.  Long term goal is to lose weight.  He would like to lose 40-50 pounds. Advised pt to attend the group nutrition classes on Tuesdays.  Pt will receive counseling specific to weight loss by the RD.  Continue to monitor his progress toward meeting this goal. Psychological Assessment PHQ score -  4 .  Pt does remark that he had some depression post surgery and has experienced some blue days.  Typically he will cope with this by taking a walk but often keeps thing bottled in.  His depression stems from not working right now, he does not have short term disability through his employer. He is not released to return back to work until May 2.  His wife is on disability and she does not work. Pt adult son is living with them and is providing financial support to the household.  Pt feels depressed that he is not able to provide for his family and having to dip some into his savings which is just about all gone.  Emotional support provided for pt and advised some of this is a normal response to having major surgery. Karlene Lineman RN

## 2014-04-11 ENCOUNTER — Encounter (HOSPITAL_COMMUNITY)
Admission: RE | Admit: 2014-04-11 | Discharge: 2014-04-11 | Disposition: A | Discharge status: Home / Self Care | Payer: 59 | Source: Ambulatory Visit | Attending: Interventional Cardiology | Admitting: Interventional Cardiology

## 2014-04-11 DIAGNOSIS — Z5189 Encounter for other specified aftercare: Secondary | ICD-10-CM | POA: Diagnosis not present

## 2014-04-13 ENCOUNTER — Encounter (HOSPITAL_COMMUNITY)
Admission: RE | Admit: 2014-04-13 | Discharge: 2014-04-13 | Disposition: A | Discharge status: Home / Self Care | Payer: 59 | Source: Ambulatory Visit | Attending: Interventional Cardiology | Admitting: Interventional Cardiology

## 2014-04-13 DIAGNOSIS — Z5189 Encounter for other specified aftercare: Secondary | ICD-10-CM | POA: Diagnosis not present

## 2014-04-16 ENCOUNTER — Encounter (HOSPITAL_COMMUNITY)
Admission: RE | Admit: 2014-04-16 | Discharge: 2014-04-16 | Disposition: A | Discharge status: Home / Self Care | Payer: 59 | Source: Ambulatory Visit | Attending: Interventional Cardiology | Admitting: Interventional Cardiology

## 2014-04-16 DIAGNOSIS — Z5189 Encounter for other specified aftercare: Secondary | ICD-10-CM | POA: Diagnosis not present

## 2014-04-18 ENCOUNTER — Encounter (HOSPITAL_COMMUNITY)
Admission: RE | Admit: 2014-04-18 | Discharge: 2014-04-18 | Disposition: A | Discharge status: Home / Self Care | Payer: 59 | Source: Ambulatory Visit | Attending: Interventional Cardiology | Admitting: Interventional Cardiology

## 2014-04-18 DIAGNOSIS — Z5189 Encounter for other specified aftercare: Secondary | ICD-10-CM | POA: Diagnosis not present

## 2014-04-18 NOTE — Progress Notes (Signed)
Reviewed home exercise with pt today.  Pt plans to walk at home and use weights when cleared for exercise.  Reviewed THR, pulse, RPE, sign and symptoms, and when to call 911 or MD.  Pt voiced understanding. Fabio Pierce, MA, ACSM RCEP

## 2014-04-20 ENCOUNTER — Encounter (HOSPITAL_COMMUNITY)
Admission: RE | Admit: 2014-04-20 | Discharge: 2014-04-20 | Disposition: A | Discharge status: Home / Self Care | Payer: 59 | Source: Ambulatory Visit | Attending: Interventional Cardiology | Admitting: Interventional Cardiology

## 2014-04-20 DIAGNOSIS — Z5189 Encounter for other specified aftercare: Secondary | ICD-10-CM | POA: Diagnosis not present

## 2014-04-23 ENCOUNTER — Encounter (HOSPITAL_COMMUNITY): Payer: 59

## 2014-04-25 ENCOUNTER — Encounter (HOSPITAL_COMMUNITY)
Admission: RE | Admit: 2014-04-25 | Discharge: 2014-04-25 | Disposition: A | Discharge status: Home / Self Care | Payer: 59 | Source: Ambulatory Visit | Attending: Interventional Cardiology | Admitting: Interventional Cardiology

## 2014-04-25 DIAGNOSIS — Z5189 Encounter for other specified aftercare: Secondary | ICD-10-CM | POA: Diagnosis not present
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
## 2014-04-27 ENCOUNTER — Encounter (HOSPITAL_COMMUNITY)
Admission: RE | Admit: 2014-04-27 | Discharge: 2014-04-27 | Disposition: A | Discharge status: Home / Self Care | Payer: 59 | Source: Ambulatory Visit | Attending: Interventional Cardiology | Admitting: Interventional Cardiology

## 2014-04-27 DIAGNOSIS — Z5189 Encounter for other specified aftercare: Secondary | ICD-10-CM | POA: Diagnosis not present

## 2014-04-30 ENCOUNTER — Encounter (HOSPITAL_COMMUNITY)
Admission: RE | Admit: 2014-04-30 | Discharge: 2014-04-30 | Disposition: A | Discharge status: Home / Self Care | Payer: 59 | Source: Ambulatory Visit | Attending: Interventional Cardiology | Admitting: Interventional Cardiology

## 2014-04-30 DIAGNOSIS — Z5189 Encounter for other specified aftercare: Secondary | ICD-10-CM | POA: Diagnosis not present

## 2014-05-02 ENCOUNTER — Encounter (HOSPITAL_COMMUNITY)
Admission: RE | Admit: 2014-05-02 | Discharge: 2014-05-02 | Disposition: A | Discharge status: Home / Self Care | Payer: 59 | Source: Ambulatory Visit | Attending: Interventional Cardiology | Admitting: Interventional Cardiology

## 2014-05-02 DIAGNOSIS — Z5189 Encounter for other specified aftercare: Secondary | ICD-10-CM | POA: Diagnosis not present

## 2014-05-04 ENCOUNTER — Encounter (HOSPITAL_COMMUNITY)
Admission: RE | Admit: 2014-05-04 | Discharge: 2014-05-04 | Disposition: A | Discharge status: Home / Self Care | Payer: 59 | Source: Ambulatory Visit | Attending: Interventional Cardiology | Admitting: Interventional Cardiology

## 2014-05-04 DIAGNOSIS — I252 Old myocardial infarction: Secondary | ICD-10-CM | POA: Insufficient documentation

## 2014-05-04 DIAGNOSIS — E785 Hyperlipidemia, unspecified: Secondary | ICD-10-CM | POA: Diagnosis not present

## 2014-05-04 DIAGNOSIS — Z8249 Family history of ischemic heart disease and other diseases of the circulatory system: Secondary | ICD-10-CM | POA: Diagnosis not present

## 2014-05-04 DIAGNOSIS — M109 Gout, unspecified: Secondary | ICD-10-CM | POA: Insufficient documentation

## 2014-05-04 DIAGNOSIS — I10 Essential (primary) hypertension: Secondary | ICD-10-CM | POA: Insufficient documentation

## 2014-05-04 DIAGNOSIS — Z951 Presence of aortocoronary bypass graft: Secondary | ICD-10-CM | POA: Diagnosis not present

## 2014-05-04 DIAGNOSIS — Z5189 Encounter for other specified aftercare: Secondary | ICD-10-CM | POA: Insufficient documentation

## 2014-05-04 DIAGNOSIS — I25110 Atherosclerotic heart disease of native coronary artery with unstable angina pectoris: Secondary | ICD-10-CM | POA: Diagnosis not present

## 2014-05-04 DIAGNOSIS — R739 Hyperglycemia, unspecified: Secondary | ICD-10-CM | POA: Insufficient documentation

## 2014-05-07 ENCOUNTER — Encounter (HOSPITAL_COMMUNITY)
Admission: RE | Admit: 2014-05-07 | Discharge: 2014-05-07 | Disposition: A | Discharge status: Home / Self Care | Payer: 59 | Source: Ambulatory Visit | Attending: Interventional Cardiology | Admitting: Interventional Cardiology

## 2014-05-07 ENCOUNTER — Encounter: Payer: Self-pay | Admitting: Cardiology

## 2014-05-07 ENCOUNTER — Encounter: Payer: Self-pay | Admitting: *Deleted

## 2014-05-07 ENCOUNTER — Ambulatory Visit (INDEPENDENT_AMBULATORY_CARE_PROVIDER_SITE_OTHER): Payer: 59 | Admitting: Cardiology

## 2014-05-07 ENCOUNTER — Other Ambulatory Visit (INDEPENDENT_AMBULATORY_CARE_PROVIDER_SITE_OTHER): Payer: 59 | Admitting: *Deleted

## 2014-05-07 VITALS — BP 106/70 | HR 46 | Ht 79.0 in | Wt 283.0 lb

## 2014-05-07 DIAGNOSIS — I2510 Atherosclerotic heart disease of native coronary artery without angina pectoris: Secondary | ICD-10-CM

## 2014-05-07 DIAGNOSIS — Z5189 Encounter for other specified aftercare: Secondary | ICD-10-CM | POA: Diagnosis not present

## 2014-05-07 DIAGNOSIS — D649 Anemia, unspecified: Secondary | ICD-10-CM | POA: Diagnosis not present

## 2014-05-07 DIAGNOSIS — M109 Gout, unspecified: Secondary | ICD-10-CM | POA: Insufficient documentation

## 2014-05-07 DIAGNOSIS — M1990 Unspecified osteoarthritis, unspecified site: Secondary | ICD-10-CM | POA: Insufficient documentation

## 2014-05-07 DIAGNOSIS — R001 Bradycardia, unspecified: Secondary | ICD-10-CM

## 2014-05-07 DIAGNOSIS — I25110 Atherosclerotic heart disease of native coronary artery with unstable angina pectoris: Secondary | ICD-10-CM | POA: Diagnosis not present

## 2014-05-07 DIAGNOSIS — E785 Hyperlipidemia, unspecified: Secondary | ICD-10-CM | POA: Diagnosis not present

## 2014-05-07 DIAGNOSIS — I10 Essential (primary) hypertension: Secondary | ICD-10-CM | POA: Diagnosis not present

## 2014-05-07 LAB — COMPREHENSIVE METABOLIC PANEL
ALT: 22 U/L (ref 0–53)
AST: 25 U/L (ref 0–37)
Albumin: 3.9 g/dL (ref 3.5–5.2)
Alkaline Phosphatase: 74 U/L (ref 39–117)
BUN: 16 mg/dL (ref 6–23)
CO2: 28 mEq/L (ref 19–32)
Calcium: 9.7 mg/dL (ref 8.4–10.5)
Chloride: 105 mEq/L (ref 96–112)
Creatinine, Ser: 0.92 mg/dL (ref 0.40–1.50)
GFR: 93.13 mL/min (ref >60.00)
Glucose, Bld: 87 mg/dL (ref 70–99)
Potassium: 4.6 mEq/L (ref 3.5–5.1)
Sodium: 137 mEq/L (ref 135–145)
Total Bilirubin: 0.7 mg/dL (ref 0.2–1.2)
Total Protein: 7.1 g/dL (ref 6.0–8.3)

## 2014-05-07 LAB — CBC WITH DIFFERENTIAL/PLATELET
Basophils Absolute: 0 10*3/uL (ref 0.0–0.1)
Basophils Relative: 0.7 % (ref 0.0–3.0)
Eosinophils Absolute: 0.1 10*3/uL (ref 0.0–0.7)
Eosinophils Relative: 2.2 % (ref 0.0–5.0)
HCT: 39.2 % (ref 39.0–52.0)
Hemoglobin: 13.2 g/dL (ref 13.0–17.0)
Lymphocytes Relative: 33.9 % (ref 12.0–46.0)
Lymphs Abs: 2 10*3/uL (ref 0.7–4.0)
MCHC: 33.8 g/dL (ref 30.0–36.0)
MCV: 91.4 fl (ref 78.0–100.0)
Monocytes Absolute: 0.5 10*3/uL (ref 0.1–1.0)
Monocytes Relative: 8.7 % (ref 3.0–12.0)
Neutro Abs: 3.2 10*3/uL (ref 1.4–7.7)
Neutrophils Relative %: 54.5 % (ref 43.0–77.0)
Platelets: 190 10*3/uL (ref 150.0–400.0)
RBC: 4.29 Mil/uL (ref 4.22–5.81)
RDW: 15.7 % — ABNORMAL HIGH (ref 11.5–15.5)
WBC: 5.9 10*3/uL (ref 4.0–10.5)

## 2014-05-07 LAB — LIPID PANEL
Cholesterol: 129 mg/dL (ref 0–200)
HDL: 28.6 mg/dL — ABNORMAL LOW (ref >39.00)
NonHDL: 100.4
Total CHOL/HDL Ratio: 5
Triglycerides: 212 mg/dL — ABNORMAL HIGH (ref 0.0–149.0)
VLDL: 42.4 mg/dL — ABNORMAL HIGH (ref 0.0–40.0)

## 2014-05-07 LAB — HEPATIC FUNCTION PANEL
ALT: 22 U/L (ref 0–53)
AST: 25 U/L (ref 0–37)
Albumin: 3.9 g/dL (ref 3.5–5.2)
Alkaline Phosphatase: 76 U/L (ref 39–117)
Bilirubin, Direct: 0.1 mg/dL (ref 0.0–0.3)
Total Bilirubin: 0.7 mg/dL (ref 0.2–1.2)
Total Protein: 7.1 g/dL (ref 6.0–8.3)

## 2014-05-07 LAB — TSH: TSH: 4.54 u[IU]/mL — ABNORMAL HIGH (ref 0.35–4.50)

## 2014-05-07 LAB — FERRITIN: Ferritin: 147.7 ng/mL (ref 22.0–322.0)

## 2014-05-07 LAB — LDL CHOLESTEROL, DIRECT: Direct LDL: 69 mg/dL

## 2014-05-07 LAB — IRON: Iron: 112 ug/dL (ref 42–165)

## 2014-05-07 MED ORDER — METOPROLOL TARTRATE 25 MG PO TABS: 12.5000 mg | ORAL_TABLET | Freq: Two times a day (BID) | ORAL | Status: DC

## 2014-05-07 NOTE — Addendum Note (Signed)
Addended by: Tonita Phoenix on: 05/07/2014 08:34 AM   Modules accepted: Orders

## 2014-05-07 NOTE — Patient Instructions (Addendum)
Your physician has recommended you make the following change in your medication:   DECREASE YOUR METOPROLOL TARTRATE TO 12.5 MG TWICE DAILY    Your physician recommends that you return for lab work in: TODAY--CBC W DIFF, TSH, CMET, Fe, TIBC, FERRITIN     Your physician recommends that you schedule a follow-up appointment in: 3 MONTHS WITH DR Delton See
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
## 2014-05-08 ENCOUNTER — Telehealth: Payer: Self-pay | Admitting: *Deleted

## 2014-05-08 ENCOUNTER — Other Ambulatory Visit: Payer: Self-pay | Admitting: *Deleted

## 2014-05-08 DIAGNOSIS — I25110 Atherosclerotic heart disease of native coronary artery with unstable angina pectoris: Secondary | ICD-10-CM

## 2014-05-08 DIAGNOSIS — Z951 Presence of aortocoronary bypass graft: Secondary | ICD-10-CM

## 2014-05-08 DIAGNOSIS — R7989 Other specified abnormal findings of blood chemistry: Secondary | ICD-10-CM

## 2014-05-08 MED ORDER — ATORVASTATIN CALCIUM 20 MG PO TABS: 20.0000 mg | ORAL_TABLET | Freq: Every day | ORAL | Status: DC

## 2014-05-08 NOTE — Telephone Encounter (Signed)
Informed the pt that per Dr Delton See he had normal electrolytes, normal creatinine and LFTs.  Informed the pt that per Dr Delton See his hemoglobin was normal, improved from 8.1 to 13.2, and recommendation for him to d/c his iron pills.  Informed the pt that per Dr Delton See his TSH is borderline, and she recommends that he repeat this lab in 6 months.  Scheduled the pt lab appt to check TSH for 11/05/14 at our office.  Pt verbalized understanding and agrees with this plan.

## 2014-05-08 NOTE — Telephone Encounter (Signed)
No voice mail box set up on pt's phone or wife's phone. I will try to reach pt later to go over results.

## 2014-05-08 NOTE — Telephone Encounter (Signed)
See telephone encounter from today 05/08/14 with pt based on lab results per Tereso Newcomer PA and Dr Delton See.

## 2014-05-08 NOTE — Telephone Encounter (Signed)
F/u   Pt returning a calling concerning lab results.

## 2014-05-08 NOTE — Telephone Encounter (Signed)
-----   Message from Lars Masson, MD sent at 05/07/2014 11:44 PM EDT ----- Normal lytes, Crea, , LFTs. Completely normal Hb, improved from 8.1 to 13.2. He can discontinue taking iron pills.  His TSH is borderline, I would just repeat it in 6 months.

## 2014-05-09 ENCOUNTER — Encounter (HOSPITAL_COMMUNITY): Payer: 59

## 2014-05-11 ENCOUNTER — Encounter (HOSPITAL_COMMUNITY): Payer: 59

## 2014-05-14 ENCOUNTER — Encounter (HOSPITAL_COMMUNITY): Admission: RE | Admit: 2014-05-14 | Payer: 59 | Source: Ambulatory Visit

## 2014-05-16 ENCOUNTER — Encounter (HOSPITAL_COMMUNITY): Payer: 59

## 2014-05-18 ENCOUNTER — Encounter (HOSPITAL_COMMUNITY): Admission: RE | Admit: 2014-05-18 | Payer: 59 | Source: Ambulatory Visit

## 2014-05-18 ENCOUNTER — Telehealth (HOSPITAL_COMMUNITY): Payer: Self-pay | Admitting: *Deleted

## 2014-05-18 NOTE — Telephone Encounter (Signed)
Pt called after several sessions missed.  Pt out of town and did not have the rehab number.  Pt will return to work on 4/18 and will not be able to continue exercise.  Pt will continue to exercise at home with walking.  Pt wished well in his future endeavors. Alanson Aly, BSN

## 2014-05-21 ENCOUNTER — Encounter (HOSPITAL_COMMUNITY): Admission: RE | Admit: 2014-05-21 | Payer: 59 | Source: Ambulatory Visit

## 2014-05-23 ENCOUNTER — Encounter (HOSPITAL_COMMUNITY): Payer: 59

## 2014-05-25 ENCOUNTER — Encounter (HOSPITAL_COMMUNITY): Payer: 59

## 2014-05-28 ENCOUNTER — Ambulatory Visit (INDEPENDENT_AMBULATORY_CARE_PROVIDER_SITE_OTHER): Payer: Self-pay | Admitting: Physician Assistant

## 2014-05-28 ENCOUNTER — Encounter (HOSPITAL_COMMUNITY): Payer: 59

## 2014-05-28 VITALS — BP 115/76 | HR 67 | Resp 20 | Ht 79.0 in | Wt 282.0 lb

## 2014-05-28 DIAGNOSIS — Z951 Presence of aortocoronary bypass graft: Secondary | ICD-10-CM

## 2014-05-28 DIAGNOSIS — I25110 Atherosclerotic heart disease of native coronary artery with unstable angina pectoris: Secondary | ICD-10-CM

## 2014-05-28 NOTE — Progress Notes (Signed)
  HPI:  [name] presents with complaint of painful "bump"on the top of his sternotomy.  He is S/P Emergent CABG x 1 performed on 03/02/2014.  He also complains of feeling drained of energy and constantly tired.  It was felt this was likely due to his beta blocker which was reduced at this last Cardiology appointment.  Current Outpatient Prescriptions  Medication Sig Dispense Refill  . allopurinol (ZYLOPRIM) 300 MG tablet Take 600 mg by mouth daily.    Marland Kitchen aspirin EC 325 MG EC tablet Take 1 tablet (325 mg total) by mouth daily. 30 tablet 0  . atorvastatin (LIPITOR) 20 MG tablet Take 1 tablet (20 mg total) by mouth daily. (Patient taking differently: Take 40 mg by mouth daily. ) 30 tablet 3  . docusate calcium (SURFAK) 240 MG capsule Take 240 mg by mouth daily.     Marland Kitchen lisinopril (PRINIVIL,ZESTRIL) 2.5 MG tablet Take 1 tablet (2.5 mg total) by mouth daily. 30 tablet 11  . metoprolol tartrate (LOPRESSOR) 25 MG tablet Take 0.5 tablets (12.5 mg total) by mouth 2 (two) times daily. 180 tablet 3  . morphine (MSIR) 30 MG tablet Take 30 mg by mouth every 6 (six) hours as needed for severe pain.    . naproxen sodium (ANAPROX) 220 MG tablet Take 220 mg by mouth daily after breakfast. Take every day per patient    . ondansetron (ZOFRAN) 4 MG tablet Take 1 tablet (4 mg total) by mouth every 6 (six) hours. 12 tablet 0   No current facility-administered medications for this visit.    Physical Exam:  BP 115/76 mmHg  Pulse 67  Resp 20  Ht 6\' 7"  (2.007 m)  Wt 282 lb (127.914 kg)  BMI 31.76 kg/m2  SpO2 98%  Gen: no apparent distress Heart: RRR Lungs: CTA bilaterally Skin: incision well healed, small area at inferior portion of sternotomy that is painful to touch, no signs and symptoms of infection  A/P:  1. Painful "Bump"- this is likely a broken sternal wire.  The patient states its not all that bothersome.  His sternum is stable.  There is no acute infection present 2. Fatigue- patients  bradycardic in office today, feels runs down will decrease Beta blocker to once a day.  Patient wants to be off medicine completely but I explained the importance of Beta Blocker use post cabg.  He was agreeable to the once a day dose 3. RTC prn if any further problems  Lowella Dandy, PA-C [facility] [phone]

## 2014-05-30 ENCOUNTER — Encounter (HOSPITAL_COMMUNITY): Payer: 59

## 2014-06-01 ENCOUNTER — Encounter (HOSPITAL_COMMUNITY): Payer: 59

## 2014-06-04 ENCOUNTER — Encounter (HOSPITAL_COMMUNITY): Payer: 59

## 2014-06-04 ENCOUNTER — Ambulatory Visit: Payer: 59 | Admitting: Thoracic Surgery (Cardiothoracic Vascular Surgery)

## 2014-06-05 ENCOUNTER — Other Ambulatory Visit: Payer: Self-pay

## 2014-06-05 DIAGNOSIS — I25810 Atherosclerosis of coronary artery bypass graft(s) without angina pectoris: Secondary | ICD-10-CM

## 2014-06-05 DIAGNOSIS — R0789 Other chest pain: Secondary | ICD-10-CM

## 2014-06-06 ENCOUNTER — Encounter (HOSPITAL_COMMUNITY): Payer: 59

## 2014-06-07 ENCOUNTER — Encounter: Payer: Self-pay | Admitting: Thoracic Surgery (Cardiothoracic Vascular Surgery)

## 2014-06-07 ENCOUNTER — Ambulatory Visit (INDEPENDENT_AMBULATORY_CARE_PROVIDER_SITE_OTHER): Payer: 59 | Admitting: Thoracic Surgery (Cardiothoracic Vascular Surgery)

## 2014-06-07 ENCOUNTER — Other Ambulatory Visit: Payer: 59

## 2014-06-07 ENCOUNTER — Ambulatory Visit
Admission: RE | Admit: 2014-06-07 | Discharge: 2014-06-07 | Disposition: A | Discharge status: Home / Self Care | Payer: 59 | Source: Ambulatory Visit | Attending: Thoracic Surgery (Cardiothoracic Vascular Surgery) | Admitting: Thoracic Surgery (Cardiothoracic Vascular Surgery)

## 2014-06-07 VITALS — BP 126/81 | HR 58 | Resp 20 | Ht 79.0 in | Wt 282.0 lb

## 2014-06-07 DIAGNOSIS — Z951 Presence of aortocoronary bypass graft: Secondary | ICD-10-CM

## 2014-06-07 DIAGNOSIS — I2510 Atherosclerotic heart disease of native coronary artery without angina pectoris: Secondary | ICD-10-CM | POA: Diagnosis not present

## 2014-06-07 DIAGNOSIS — R0789 Other chest pain: Secondary | ICD-10-CM

## 2014-06-07 DIAGNOSIS — I25810 Atherosclerosis of coronary artery bypass graft(s) without angina pectoris: Secondary | ICD-10-CM

## 2014-06-07 NOTE — Patient Instructions (Signed)
Patient may resume unrestricted physical activity without any particular limitations at this time.  Patient should try to refrain from rubbing the skin over his sternal wires
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
## 2014-06-08 ENCOUNTER — Encounter (HOSPITAL_COMMUNITY): Payer: 59

## 2014-06-11 ENCOUNTER — Encounter (HOSPITAL_COMMUNITY): Payer: 59

## 2014-06-13 ENCOUNTER — Encounter (HOSPITAL_COMMUNITY): Payer: 59

## 2014-06-15 ENCOUNTER — Encounter (HOSPITAL_COMMUNITY): Payer: 59

## 2014-06-18 ENCOUNTER — Encounter (HOSPITAL_COMMUNITY): Payer: 59

## 2014-06-20 ENCOUNTER — Encounter (HOSPITAL_COMMUNITY): Payer: 59

## 2014-06-22 ENCOUNTER — Encounter (HOSPITAL_COMMUNITY): Payer: 59

## 2014-06-25 ENCOUNTER — Encounter (HOSPITAL_COMMUNITY): Payer: 59

## 2014-06-27 ENCOUNTER — Encounter (HOSPITAL_COMMUNITY): Payer: 59

## 2014-06-29 ENCOUNTER — Encounter (HOSPITAL_COMMUNITY): Payer: 59

## 2014-07-02 ENCOUNTER — Encounter (HOSPITAL_COMMUNITY): Payer: 59

## 2014-07-03 ENCOUNTER — Other Ambulatory Visit (INDEPENDENT_AMBULATORY_CARE_PROVIDER_SITE_OTHER): Payer: 59

## 2014-07-03 DIAGNOSIS — Z951 Presence of aortocoronary bypass graft: Secondary | ICD-10-CM

## 2014-07-03 DIAGNOSIS — I25110 Atherosclerotic heart disease of native coronary artery with unstable angina pectoris: Secondary | ICD-10-CM

## 2014-07-04 ENCOUNTER — Encounter (HOSPITAL_COMMUNITY): Payer: 59

## 2014-07-06 ENCOUNTER — Encounter (HOSPITAL_COMMUNITY): Payer: 59

## 2014-07-09 ENCOUNTER — Encounter (HOSPITAL_COMMUNITY): Payer: 59

## 2014-07-11 ENCOUNTER — Encounter (HOSPITAL_COMMUNITY): Payer: 59

## 2014-07-13 ENCOUNTER — Encounter (HOSPITAL_COMMUNITY): Payer: 59

## 2014-09-04 ENCOUNTER — Encounter: Payer: Self-pay | Admitting: Cardiology

## 2014-09-04 ENCOUNTER — Ambulatory Visit (INDEPENDENT_AMBULATORY_CARE_PROVIDER_SITE_OTHER): Payer: 59 | Admitting: Cardiology

## 2014-09-04 VITALS — BP 114/70 | HR 58 | Ht 79.0 in | Wt 297.0 lb

## 2014-09-04 DIAGNOSIS — R079 Chest pain, unspecified: Secondary | ICD-10-CM | POA: Diagnosis not present

## 2014-09-04 DIAGNOSIS — D509 Iron deficiency anemia, unspecified: Secondary | ICD-10-CM

## 2014-09-04 DIAGNOSIS — I2583 Coronary atherosclerosis due to lipid rich plaque: Secondary | ICD-10-CM

## 2014-09-04 DIAGNOSIS — E785 Hyperlipidemia, unspecified: Secondary | ICD-10-CM | POA: Diagnosis not present

## 2014-09-04 DIAGNOSIS — I5031 Acute diastolic (congestive) heart failure: Secondary | ICD-10-CM

## 2014-09-04 DIAGNOSIS — I10 Essential (primary) hypertension: Secondary | ICD-10-CM | POA: Diagnosis not present

## 2014-09-04 DIAGNOSIS — Z951 Presence of aortocoronary bypass graft: Secondary | ICD-10-CM

## 2014-09-04 DIAGNOSIS — I2510 Atherosclerotic heart disease of native coronary artery without angina pectoris: Secondary | ICD-10-CM

## 2014-09-04 MED ORDER — CARVEDILOL 3.125 MG PO TABS: 3.1250 mg | ORAL_TABLET | Freq: Two times a day (BID) | ORAL | Status: DC

## 2014-09-04 MED ORDER — FUROSEMIDE 40 MG PO TABS: 40.0000 mg | ORAL_TABLET | Freq: Every day | ORAL | Status: DC

## 2014-09-04 NOTE — Patient Instructions (Signed)
Medication Instructions:   STOP METOPROLOL NOW  STOP LISINOPRIL NOW  START TAKING CARVEDILOL 3.125 MG TWICE DAILY  START TAKING LASIX 40 MG ONCE DAILY    Testing/Procedures:  Your physician has requested that you have an echocardiogram. Echocardiography is a painless test that uses sound waves to create images of your heart. It provides your doctor with information about the size and shape of your heart and how well your heart's chambers and valves are working. This procedure takes approximately one hour. There are no restrictions for this procedure.    Follow-Up:  ONE MONTH WITH DR Hollice Espy IN September WITH DR Delton See
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
## 2014-09-08 ENCOUNTER — Encounter (HOSPITAL_COMMUNITY): Payer: Self-pay | Admitting: Emergency Medicine

## 2014-09-08 ENCOUNTER — Emergency Department (HOSPITAL_COMMUNITY)
Admission: EM | Admit: 2014-09-08 | Discharge: 2014-09-08 | Disposition: A | Discharge status: Home / Self Care | Payer: 59 | Attending: Emergency Medicine | Admitting: Emergency Medicine

## 2014-09-08 ENCOUNTER — Emergency Department (HOSPITAL_COMMUNITY): Payer: 59

## 2014-09-08 DIAGNOSIS — I2510 Atherosclerotic heart disease of native coronary artery without angina pectoris: Secondary | ICD-10-CM | POA: Insufficient documentation

## 2014-09-08 DIAGNOSIS — Z791 Long term (current) use of non-steroidal anti-inflammatories (NSAID): Secondary | ICD-10-CM | POA: Insufficient documentation

## 2014-09-08 DIAGNOSIS — E785 Hyperlipidemia, unspecified: Secondary | ICD-10-CM | POA: Diagnosis not present

## 2014-09-08 DIAGNOSIS — R42 Dizziness and giddiness: Secondary | ICD-10-CM | POA: Diagnosis present

## 2014-09-08 DIAGNOSIS — Z87828 Personal history of other (healed) physical injury and trauma: Secondary | ICD-10-CM | POA: Diagnosis not present

## 2014-09-08 DIAGNOSIS — M109 Gout, unspecified: Secondary | ICD-10-CM | POA: Insufficient documentation

## 2014-09-08 DIAGNOSIS — Z7982 Long term (current) use of aspirin: Secondary | ICD-10-CM | POA: Diagnosis not present

## 2014-09-08 DIAGNOSIS — Z9889 Other specified postprocedural states: Secondary | ICD-10-CM | POA: Diagnosis not present

## 2014-09-08 DIAGNOSIS — M791 Myalgia: Secondary | ICD-10-CM | POA: Diagnosis not present

## 2014-09-08 DIAGNOSIS — Z951 Presence of aortocoronary bypass graft: Secondary | ICD-10-CM | POA: Diagnosis not present

## 2014-09-08 DIAGNOSIS — I10 Essential (primary) hypertension: Secondary | ICD-10-CM | POA: Diagnosis not present

## 2014-09-08 DIAGNOSIS — R0602 Shortness of breath: Secondary | ICD-10-CM | POA: Insufficient documentation

## 2014-09-08 DIAGNOSIS — R55 Syncope and collapse: Secondary | ICD-10-CM

## 2014-09-08 DIAGNOSIS — Z79891 Long term (current) use of opiate analgesic: Secondary | ICD-10-CM | POA: Insufficient documentation

## 2014-09-08 DIAGNOSIS — Z79899 Other long term (current) drug therapy: Secondary | ICD-10-CM | POA: Diagnosis not present

## 2014-09-08 DIAGNOSIS — I252 Old myocardial infarction: Secondary | ICD-10-CM | POA: Diagnosis not present

## 2014-09-08 LAB — CBC WITH DIFFERENTIAL/PLATELET
Basophils Absolute: 0 10*3/uL (ref 0.0–0.1)
Basophils Relative: 0 % (ref 0–1)
Eosinophils Absolute: 0.1 10*3/uL (ref 0.0–0.7)
Eosinophils Relative: 2 % (ref 0–5)
HCT: 37.6 % — ABNORMAL LOW (ref 39.0–52.0)
Hemoglobin: 13 g/dL (ref 13.0–17.0)
Lymphocytes Relative: 19 % (ref 12–46)
Lymphs Abs: 1.5 10*3/uL (ref 0.7–4.0)
MCH: 32.3 pg (ref 26.0–34.0)
MCHC: 34.6 g/dL (ref 30.0–36.0)
MCV: 93.3 fL (ref 78.0–100.0)
Monocytes Absolute: 0.6 10*3/uL (ref 0.1–1.0)
Monocytes Relative: 8 % (ref 3–12)
Neutro Abs: 5.5 10*3/uL (ref 1.7–7.7)
Neutrophils Relative %: 71 % (ref 43–77)
Platelets: 181 10*3/uL (ref 150–400)
RBC: 4.03 MIL/uL — ABNORMAL LOW (ref 4.22–5.81)
RDW: 12.5 % (ref 11.5–15.5)
WBC: 7.8 10*3/uL (ref 4.0–10.5)

## 2014-09-08 LAB — BASIC METABOLIC PANEL
Anion gap: 10 (ref 5–15)
BUN: 18 mg/dL (ref 6–20)
CO2: 23 mmol/L (ref 22–32)
Calcium: 8.8 mg/dL — ABNORMAL LOW (ref 8.9–10.3)
Chloride: 98 mmol/L — ABNORMAL LOW (ref 101–111)
Creatinine, Ser: 1.08 mg/dL (ref 0.61–1.24)
GFR calc Af Amer: >60 mL/min (ref >60)
GFR calc non Af Amer: >60 mL/min (ref >60)
Glucose, Bld: 100 mg/dL — ABNORMAL HIGH (ref 65–99)
Potassium: 3.6 mmol/L (ref 3.5–5.1)
Sodium: 131 mmol/L — ABNORMAL LOW (ref 135–145)

## 2014-09-08 LAB — I-STAT TROPONIN, ED: Troponin i, poc: 0 ng/mL (ref 0.00–0.08)

## 2014-09-08 LAB — MAGNESIUM: Magnesium: 1.9 mg/dL (ref 1.7–2.4)

## 2014-09-08 MED ORDER — SODIUM CHLORIDE 0.9 % IV BOLUS (SEPSIS)
1000.0000 mL | Freq: Once | INTRAVENOUS | Status: AC
  Administered 2014-09-08: 1000 mL via INTRAVENOUS

## 2014-09-08 MED ORDER — MORPHINE SULFATE 15 MG PO TABS
30.0000 mg | ORAL_TABLET | Freq: Once | ORAL | Status: AC
  Administered 2014-09-08: 30 mg via ORAL
  Filled 2014-09-08: qty 2

## 2014-09-08 NOTE — Discharge Instructions (Signed)
Near-Syncope °Near-syncope (commonly known as near fainting) is sudden weakness, dizziness, or feeling like you might pass out. During an episode of near-syncope, you may also develop pale skin, have tunnel vision, or feel sick to your stomach (nauseous). Near-syncope may occur when getting up after sitting or while standing for a long time. It is caused by a sudden decrease in blood flow to the brain. This decrease can result from various causes or triggers, most of which are not serious. However, because near-syncope can sometimes be a sign of something serious, a medical evaluation is required. The specific cause is often not determined. °HOME CARE INSTRUCTIONS  °Monitor your condition for any changes. The following actions may help to alleviate any discomfort you are experiencing: °· Have someone stay with you until you feel stable. °· Lie down right away and prop your feet up if you start feeling like you might faint. Breathe deeply and steadily. Wait until all the symptoms have passed. Most of these episodes last only a few minutes. You may feel tired for several hours.   °· Drink enough fluids to keep your urine clear or pale yellow.   °· If you are taking blood pressure or heart medicine, get up slowly when seated or lying down. Take several minutes to sit and then stand. This can reduce dizziness. °· Follow up with your health care provider as directed.  °SEEK IMMEDIATE MEDICAL CARE IF:  °· You have a severe headache.   °· You have unusual pain in the chest, abdomen, or back.   °· You are bleeding from the mouth or rectum, or you have black or tarry stool.   °· You have an irregular or very fast heartbeat.   °· You have repeated fainting or have seizure-like jerking during an episode.   °· You faint when sitting or lying down.   °· You have confusion.   °· You have difficulty walking.   °· You have severe weakness.   °· You have vision problems.   °MAKE SURE YOU:  °· Understand these instructions. °· Will  watch your condition. °· Will get help right away if you are not doing well or get worse. °Document Released: 01/19/2005 Document Revised: 01/24/2013 Document Reviewed: 06/24/2012 °ExitCare® Patient Information ©2015 [employer]. This information is not intended to replace advice given to you by your health care provider. Make sure you discuss any questions you have with your health care provider. ° °Heat-Related Illness °Heat-related illnesses occur when the body is unable to properly cool itself. The body normally cools itself by sweating. However, under some conditions sweating is not enough. In these cases, a person's body temperature rises rapidly. Very high body temperatures may damage the brain or other vital organs. Some examples of heat-related illnesses include: °· Heat stroke. This occurs when the body is unable to regulate its temperature. The body's temperature rises rapidly, the sweating mechanism fails, and the body is unable to cool down. Body temperature may rise to 106° F (41° C) or higher within 10 to 15 minutes. Heat stroke can cause death or permanent disability if emergency treatment is not provided. °· Heat exhaustion. This is a milder form of heat-related illness that can develop after several days of exposure to high temperatures and not enough fluids. It is the body's response to an excessive loss of the water and salt contained in sweat. °· Heat cramps. These usually affect people who sweat a lot during heavy activity. This sweating drains the body's salt and moisture. The low salt level in the muscles causes painful cramps. Heat   cramps may also be a symptom of heat exhaustion. Heat cramps usually occur in the abdomen, arms, or legs. Get medical attention for cramps if you have heart problems or are on a low-sodium diet. °Those that are at greatest risk for heat-related illnesses include:  °· The elderly. °· Infant and the very young. °· People with mental illness and chronic  diseases. °· People who are overweight (obese). °· Young and healthy people can even succumb to heat if they participate in strenuous physical activities during hot weather. °CAUSES  °Several factors affect the body's ability to cool itself during extremely hot weather. When the humidity is high, sweat will not evaporate as quickly. This prevents the body from releasing heat quickly. Other factors that can affect the body's ability to cool down include:  °· Age. °· Obesity. °· Fever. °· Dehydration. °· Heart disease. °· Mental illness. °· Poor circulation. °· Sunburn. °· Prescription drug use. °· Alcohol use. °SYMPTOMS  °Heat stroke: Warning signs of heat stroke vary, but may include: °· An extremely high body temperature (above 103°F orally). °· A fast, strong pulse. °· Dizziness. °· Confusion. °· Red, hot, and dry skin. °· No sweating. °· Throbbing headache. °· Feeling sick to your stomach (nauseous). °· Unconsciousness. °Heat exhaustion: Warning signs of heat exhaustion include: °· Heavy sweating. °· Tiredness. °· Headache. °· Paleness. °· Weakness. °· Feeling sick to your stomach (nauseous) or vomiting. °· Muscle cramps. °Heat cramps °· Muscle pains or spasms. °TREATMENT  °Heat stroke °· Get into a cool environment. An indoor place that is air-conditioned may be best. °· Take a cool shower or bath. Have someone around to make sure you are okay. °· Take your temperature. Make sure it is going down. °Heat exhaustion °· Drink plenty of fluids. Do not drink liquids that contain caffeine, alcohol, or large amounts of sugar. These cause you to lose more body fluid. Also, avoid very cold drinks. They can cause stomach cramps. °· Get into a cool environment. An indoor place that is air-conditioned may be best. °· Take a cool shower or bath. Have someone around to make sure you are okay. °· Put on lightweight clothing. °Heat cramps °· Stop whatever activity you were doing. Do not attempt to do that activity for at least  3 hours after the cramps have gone away. °· Get into a cool environment. An indoor place that is air-conditioned may be best. °HOME CARE INSTRUCTIONS  °To protect your health when temperatures are extremely high, follow these tips: °· During heavy exercise in a hot environment, drink two to four glasses (16-32 ounces) of cool fluids each hour. Do not wait until you are thirsty to drink. Warning: If your caregiver limits the amount of fluid you drink or has you on water pills, ask how much you should drink while the weather is hot. °· Do not drink liquids that contain caffeine, alcohol, or large amounts of sugar. These cause you to lose more body fluid. °· Avoid very cold drinks. They can cause stomach cramps. °· Wear appropriate clothing. Choose lightweight, light-colored, loose-fitting clothing. °· If you must be outdoors, try to limit your outdoor activity to morning and evening hours. Try to rest often in shady areas. °· If you are not used to working or exercising in a hot environment, start slowly and pick up the pace gradually. °· Stay cool in an air-conditioned place if possible. If your home does not have air conditioning, go to the shopping mall or public library. °·   Taking a cool shower or bath may help you cool off. °SEEK MEDICAL CARE IF:  °· You see any of the symptoms listed above. You may be dealing with a life-threatening emergency. °· Symptoms worsen or last longer than 1 hour. °· Heat cramps do not get better in 1 hour. °MAKE SURE YOU:  °· Understand these instructions. °· Will watch your condition. °· Will get help right away if you are not doing well or get worse. °Document Released: 10/29/2007 Document Revised: 04/13/2011 Document Reviewed: 10/29/2007 °ExitCare® Patient Information ©2015 [employer]. This information is not intended to replace advice given to you by your health care provider. Make sure you discuss any questions you have with your health care provider. ° °
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
## 2014-09-08 NOTE — ED Notes (Addendum)
Pt arrives from home via [facility] c/o dizziness, lightheadedness, leg cramping this afternoon after 3 hours working outside.  Pt states recently had bypass and BP meds were changed this Wednesday.  EMS reports ortho VS changes from HR 60 to HR 90, BP 150/100 to BP 130/80.  EMS reports pt received 500 mL NS.  NAD at this time, resp e/u.

## 2014-09-08 NOTE — ED Notes (Signed)
Pt asking for something for pain, reports using morphine 30mg  PO at home.

## 2014-09-12 ENCOUNTER — Other Ambulatory Visit: Payer: Self-pay

## 2014-09-12 MED ORDER — ATORVASTATIN CALCIUM 20 MG PO TABS
20.0000 mg | ORAL_TABLET | Freq: Every day | ORAL | Status: DC
Start: 2014-09-12 — End: 2015-01-24

## 2014-09-14 ENCOUNTER — Other Ambulatory Visit: Payer: Self-pay

## 2014-09-14 ENCOUNTER — Ambulatory Visit (HOSPITAL_COMMUNITY): Payer: 59 | Attending: Cardiology

## 2014-09-14 DIAGNOSIS — Z951 Presence of aortocoronary bypass graft: Secondary | ICD-10-CM | POA: Diagnosis not present

## 2014-09-14 DIAGNOSIS — I517 Cardiomegaly: Secondary | ICD-10-CM | POA: Insufficient documentation

## 2014-09-14 DIAGNOSIS — R079 Chest pain, unspecified: Secondary | ICD-10-CM

## 2014-09-14 DIAGNOSIS — I340 Nonrheumatic mitral (valve) insufficiency: Secondary | ICD-10-CM | POA: Diagnosis not present

## 2014-09-14 DIAGNOSIS — I10 Essential (primary) hypertension: Secondary | ICD-10-CM | POA: Diagnosis not present

## 2014-09-14 DIAGNOSIS — E785 Hyperlipidemia, unspecified: Secondary | ICD-10-CM | POA: Insufficient documentation

## 2014-09-19 ENCOUNTER — Telehealth: Payer: Self-pay | Admitting: Cardiology

## 2014-09-19 NOTE — Telephone Encounter (Signed)
New message       Talk to the nurse to get a better understanding regarding his valve leakage

## 2014-09-19 NOTE — Telephone Encounter (Signed)
Attempted to call the pt and no answer and no VM set-up.

## 2014-09-19 NOTE — Telephone Encounter (Signed)
Contacted the pt to reiterate his echo results per Dr Delton See showing normal LVEF, mild MR and TR, otherwise normal study. Provided pt education on the terms/diagnosis mentioned above and thoroughly explained to the pt that his pumping function of his heart is WNL.  Pt verbalized clear understanding of his echo results and gracious for all the assistance.  Pt verbalized reassurance of his results,  when his echo was explained in depth to him.

## 2014-11-01 ENCOUNTER — Ambulatory Visit (INDEPENDENT_AMBULATORY_CARE_PROVIDER_SITE_OTHER): Payer: 59 | Admitting: Cardiology

## 2014-11-01 ENCOUNTER — Other Ambulatory Visit: Payer: 59

## 2014-11-01 ENCOUNTER — Encounter: Payer: Self-pay | Admitting: Cardiology

## 2014-11-01 VITALS — BP 136/84 | HR 64 | Ht 78.0 in | Wt 290.0 lb

## 2014-11-01 DIAGNOSIS — I10 Essential (primary) hypertension: Secondary | ICD-10-CM

## 2014-11-01 DIAGNOSIS — R7989 Other specified abnormal findings of blood chemistry: Secondary | ICD-10-CM

## 2014-11-01 DIAGNOSIS — R0683 Snoring: Secondary | ICD-10-CM

## 2014-11-01 DIAGNOSIS — I5032 Chronic diastolic (congestive) heart failure: Secondary | ICD-10-CM | POA: Insufficient documentation

## 2014-11-01 DIAGNOSIS — Z951 Presence of aortocoronary bypass graft: Secondary | ICD-10-CM

## 2014-11-01 DIAGNOSIS — R079 Chest pain, unspecified: Secondary | ICD-10-CM

## 2014-11-01 DIAGNOSIS — E785 Hyperlipidemia, unspecified: Secondary | ICD-10-CM

## 2014-11-01 MED ORDER — FUROSEMIDE 40 MG PO TABS: 40.0000 mg | ORAL_TABLET | Freq: Every day | ORAL | Status: DC

## 2014-11-01 NOTE — Addendum Note (Signed)
Addended by: Loa Socks on: 11/01/2014 12:12 PM   Modules accepted: Orders

## 2014-11-01 NOTE — Patient Instructions (Addendum)
Medication Instructions:   Your physician has recommended you make the following change in your medication:   TAKE LASIX 40 MG ONCE DAILY IN THE MORNING.  PER DR NELSON YOU MAY TAKE AN EXTRA LASIX 40 MG BY MOUTH IN THE EVENING ONLY AS NEEDED FOR LOWER EXTREMITY EDEMA     Labwork:  3 MONTHS, SAME DAY AS YOUR 3 MONTH FOLLOW-UP APPOINTMENT WITH DR NELSON--CHECK A CMET    Testing/Procedures:  Your physician has recommended that you have a sleep study. This test records several body functions during sleep, including: brain activity, eye movement, oxygen and carbon dioxide blood levels, heart rate and rhythm, breathing rate and rhythm, the flow of air through your mouth and nose, snoring, body muscle movements, and chest and belly movement.     Follow-Up:  3 MONTHS WITH DR NELSON---PLEASE HAVE YOUR LAB DONE THIS VERY SAME DAY    DR Delton See WOULD LIKE FOR YOU TO ESTABLISH WITH A PCP AT [facility] IN [place]---YOU MUST CALL [phone] TO OBTAIN A NEW PATIENT APPOINTMENT
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
## 2014-11-05 ENCOUNTER — Other Ambulatory Visit: Payer: 59

## 2015-01-01 ENCOUNTER — Telehealth: Payer: Self-pay | Admitting: Cardiology

## 2015-01-01 NOTE — Telephone Encounter (Signed)
New Message  Pt wife calling- requested for RN to call pt due to pt c/o feeling "droopy", and "having a hard time getting going"- pt wife stated that pt's coworkers have noticed that his face/hands are pale. Please call back and discuss.

## 2015-01-01 NOTE — Telephone Encounter (Signed)
Informed the pt that per Dr Delton See she recommends that he hold off on carvedilol for now.  Advised the pt to hold this for the week, and start monitoring his BP and HR.  Advised the pt to call into the office on this Friday to report current symptoms and his BP and HR.  Pt verbalized understanding and agrees with this plan, stating "I am going to buy a bp monitor today."

## 2015-01-01 NOTE — Telephone Encounter (Signed)
Please tell him to hold off carvedilol for now

## 2015-01-01 NOTE — Telephone Encounter (Signed)
Pt calling to report to Dr Delton See that since he started taking carvedilol, back in August, he has noted that he becomes more sluggish and tired throughout the day.  Pt states that he works a full time job in [employer], where he is on his feet for 12 hours plus, and has also noted that he has LEE on his working days.  Pt states he takes his lasix 40 mg po daily, and usually has to take an additional lasix EOD for the swelling in his extremities from being on his feet all day.  Pt states he does monitor his weight daily, and this has maintained, that actually he has lost some weight, for he states he is eating better.  Pt states that he does not take his BP and HR daily. Pt states that some days when he takes his carvedilol, and has to take his lasix, with an additional one, it completely wipes him out to the point where he becomes sluggish and has been reported by his coworkers as looking pale in the face.  Pt states he has no cp, sob, palpitations, dizziness, DOE, pre-syncopal, or syncopal issues at this time.  Pt is due to come back and see Dr Delton See on 12/29.  Pt reports that he also takes a strong narcotic medication, Morphine, 3 times a day.  Pt would like for Dr Delton See to advise if his issues are coming from his med changes, or is this something else causing his issues.  Informed the pt Dr Delton See is currently out of the office today, but I will send this message to her for further review and recommendation, and follow-up with him thereafter.  Advised the pt that he should purchase a BP cuff and monitor his BP and HR in the mornings, prior to taking his meds.  Advised the pt that he should maintain good hydration, for his BP could be low, which could cause his issues he complains about.  Advised the pt to continue monitoring his weight daily.  Advised the pt to maintain a low sodium diet.  Advised the pt to wear some kind of support stockings while working on his feet for extended hours.   Advised the pt that if he becomes acutely distressed with cp, sob, doe, palpitations, dizziness, pre-syncopal or syncopal issues, then he should seek immediate medical attention.  Pt verbalized understanding and agrees with this plan.

## 2015-01-04 ENCOUNTER — Telehealth: Payer: Self-pay | Admitting: Cardiology

## 2015-01-04 NOTE — Telephone Encounter (Signed)
Notified the pt that per Dr Delton See she recommends that based on his numbers below and stating that he is feeling better, he should just hold off his BB until the next OV on 01/31/15.  Pt verbalized understanding and agrees with this plan.

## 2015-01-04 NOTE — Telephone Encounter (Signed)
I would just hold BB until the next visit

## 2015-01-04 NOTE — Telephone Encounter (Signed)
This message is attached to 11/29 telephone encounter with the pt.

## 2015-01-04 NOTE — Telephone Encounter (Signed)
Logged BP readings given by this pt on 01/04/15:  1. What are your last 5 BP readings? Wed 123/85 HR 59,wed night 128/77 HR 58, thurs am 126/90 HR 62, thurs pm 126/78 HR 60, this am 131/94 HR 62  2. Are you having any other symptoms (ex. Dizziness, headache, blurred vision, passed out)? no 3. What is your BP issue? Calling to give bp/HR readings and pt request to talk to the nurse  Pt calling back as instructed by myself and Dr Delton See, to report his BP and HR, since stopping his carvedilol on 11/29 by Dr Delton See.  Pts carvedilol was stopped by Dr Delton See, because the pt called in with c/o feeling sluggish and more tired throughout the day, since starting carvedilol.  Pt was instructed to send his HR and BP readings since stopping this med.  Above mentioned are his current numbers.  Pt states he is feeling much better since stopping this med.  Informed the pt that I will send this message to Dr Delton See for further and recommendation if any and follow-up with him thereafter.  Pt verbalized understanding and agrees with this plan.  Pt states he's gracious for all the help provided on making him feel better.

## 2015-01-04 NOTE — Telephone Encounter (Signed)
New message      Pt c/o BP issue: STAT if pt c/o blurred vision, one-sided weakness or slurred speech  1. What are your last 5 BP readings? Wed 123/85 HR 59,wed night 128/77 HR 58, thurs am 126/90 HR 62, thurs pm 126/78 HR 60, this am 131/94 HR 62  2. Are you having any other symptoms (ex. Dizziness, headache, blurred vision, passed out)? no 3. What is your BP issue? Calling to give bp/HR readings and pt request to talk to the nurse

## 2015-01-22 ENCOUNTER — Encounter: Payer: Self-pay | Admitting: Cardiology

## 2015-01-24 ENCOUNTER — Telehealth: Payer: Self-pay | Admitting: *Deleted

## 2015-01-24 MED ORDER — ATORVASTATIN CALCIUM 20 MG PO TABS: 20.0000 mg | ORAL_TABLET | Freq: Every day | ORAL | Status: DC

## 2015-01-24 NOTE — Telephone Encounter (Signed)
filled atorvastatin

## 2015-01-29 ENCOUNTER — Ambulatory Visit (HOSPITAL_BASED_OUTPATIENT_CLINIC_OR_DEPARTMENT_OTHER): Payer: 59 | Attending: Cardiology | Admitting: Radiology

## 2015-01-29 VITALS — Ht 78.0 in | Wt 279.0 lb

## 2015-01-29 DIAGNOSIS — I10 Essential (primary) hypertension: Secondary | ICD-10-CM

## 2015-01-29 DIAGNOSIS — Z79899 Other long term (current) drug therapy: Secondary | ICD-10-CM | POA: Insufficient documentation

## 2015-01-29 DIAGNOSIS — R5383 Other fatigue: Secondary | ICD-10-CM | POA: Diagnosis not present

## 2015-01-29 DIAGNOSIS — Z951 Presence of aortocoronary bypass graft: Secondary | ICD-10-CM

## 2015-01-29 DIAGNOSIS — Z6832 Body mass index (BMI) 32.0-32.9, adult: Secondary | ICD-10-CM | POA: Diagnosis not present

## 2015-01-29 DIAGNOSIS — R0683 Snoring: Secondary | ICD-10-CM | POA: Diagnosis not present

## 2015-01-29 DIAGNOSIS — I5032 Chronic diastolic (congestive) heart failure: Secondary | ICD-10-CM | POA: Insufficient documentation

## 2015-01-29 DIAGNOSIS — G4733 Obstructive sleep apnea (adult) (pediatric): Secondary | ICD-10-CM

## 2015-01-29 DIAGNOSIS — E669 Obesity, unspecified: Secondary | ICD-10-CM | POA: Insufficient documentation

## 2015-01-30 NOTE — Addendum Note (Signed)
Addended by: Tonita Phoenix on: 01/30/2015 03:51 PM   Modules accepted: Orders

## 2015-01-30 NOTE — Addendum Note (Signed)
Addended by: Tonita Phoenix on: 01/30/2015 03:52 PM   Modules accepted: Orders

## 2015-01-31 ENCOUNTER — Ambulatory Visit (INDEPENDENT_AMBULATORY_CARE_PROVIDER_SITE_OTHER): Payer: 59 | Admitting: Cardiology

## 2015-01-31 ENCOUNTER — Other Ambulatory Visit (INDEPENDENT_AMBULATORY_CARE_PROVIDER_SITE_OTHER): Payer: 59 | Admitting: *Deleted

## 2015-01-31 ENCOUNTER — Encounter: Payer: Self-pay | Admitting: Cardiology

## 2015-01-31 VITALS — BP 120/82 | HR 64 | Ht 78.0 in | Wt 295.0 lb

## 2015-01-31 DIAGNOSIS — I2510 Atherosclerotic heart disease of native coronary artery without angina pectoris: Secondary | ICD-10-CM | POA: Diagnosis not present

## 2015-01-31 DIAGNOSIS — I10 Essential (primary) hypertension: Secondary | ICD-10-CM

## 2015-01-31 DIAGNOSIS — E785 Hyperlipidemia, unspecified: Secondary | ICD-10-CM | POA: Diagnosis not present

## 2015-01-31 DIAGNOSIS — I213 ST elevation (STEMI) myocardial infarction of unspecified site: Secondary | ICD-10-CM | POA: Diagnosis not present

## 2015-01-31 DIAGNOSIS — I2583 Coronary atherosclerosis due to lipid rich plaque: Secondary | ICD-10-CM

## 2015-01-31 DIAGNOSIS — R0683 Snoring: Secondary | ICD-10-CM

## 2015-01-31 DIAGNOSIS — Z951 Presence of aortocoronary bypass graft: Secondary | ICD-10-CM

## 2015-01-31 DIAGNOSIS — I200 Unstable angina: Secondary | ICD-10-CM

## 2015-01-31 DIAGNOSIS — I5032 Chronic diastolic (congestive) heart failure: Secondary | ICD-10-CM

## 2015-01-31 DIAGNOSIS — I5033 Acute on chronic diastolic (congestive) heart failure: Secondary | ICD-10-CM

## 2015-01-31 DIAGNOSIS — I219 Acute myocardial infarction, unspecified: Secondary | ICD-10-CM

## 2015-01-31 LAB — COMPREHENSIVE METABOLIC PANEL
ALT: 31 U/L (ref 9–46)
AST: 37 U/L (ref 10–40)
Albumin: 4.1 g/dL (ref 3.6–5.1)
Alkaline Phosphatase: 88 U/L (ref 40–115)
BUN: 17 mg/dL (ref 7–25)
CO2: 27 mmol/L (ref 20–31)
Calcium: 9.2 mg/dL (ref 8.6–10.3)
Chloride: 104 mmol/L (ref 98–110)
Creat: 0.93 mg/dL (ref 0.60–1.35)
Glucose, Bld: 68 mg/dL (ref 65–99)
Potassium: 4 mmol/L (ref 3.5–5.3)
Sodium: 139 mmol/L (ref 135–146)
Total Bilirubin: 0.7 mg/dL (ref 0.2–1.2)
Total Protein: 7 g/dL (ref 6.1–8.1)

## 2015-01-31 NOTE — Addendum Note (Signed)
Addended by: Sydnee Cabal R on: 01/31/2015 07:44 AM   Modules accepted: Orders

## 2015-01-31 NOTE — Patient Instructions (Addendum)
Medication Instructions:  Your physician has recommended you make the following change in your medication:  Take furosemide 40 mg by mouth twice daily for one week. After one week change to furosemide 40 mg by mouth daily. May take 40 mg in the evening as needed.   Labwork: Lab work was done today  Testing/Procedures: none  Follow-Up: Your physician recommends that you schedule a follow-up appointment in: 3 months.    Any Other Special Instructions Will Be Listed Below (If Applicable).     If you need a refill on your cardiac medications before your next appointment, please call your pharmacy.
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
## 2015-02-10 ENCOUNTER — Encounter (HOSPITAL_BASED_OUTPATIENT_CLINIC_OR_DEPARTMENT_OTHER): Payer: Self-pay | Admitting: Radiology

## 2015-02-10 ENCOUNTER — Telehealth: Payer: Self-pay | Admitting: Cardiology

## 2015-02-10 DIAGNOSIS — G4733 Obstructive sleep apnea (adult) (pediatric): Secondary | ICD-10-CM

## 2015-02-10 HISTORY — DX: Obstructive sleep apnea (adult) (pediatric): G47.33

## 2015-02-10 NOTE — Sleep Study (Addendum)
   Patient Name: [name] [name], [name] [name] MRN: [id number] Study Date: 01/29/2015 Gender: Male D.O.B: May 05, 1965 Age (years): 11 Referring Provider: Lars Masson Interpreting Physician: Armanda Magic MD, ABSM RPSGT: Shelah Lewandowsky  Weight (lbs): 279 BMI: 32 Height (inches): 78 Neck Size: 17.00  CLINICAL INFORMATION Sleep Study Type: NPSG Indication for sleep study: Congestive Heart Failure, Fatigue, Obesity, Snoring, Witnessed Apneas Epworth Sleepiness Score: 11  SLEEP STUDY TECHNIQUE As per the AASM Manual for the Scoring of Sleep and Associated Events v2.3 (April 2016) with a hypopnea requiring 4% desaturations. The channels recorded and monitored were frontal, central and occipital EEG, electrooculogram (EOG), submentalis EMG (chin), nasal and oral airflow, thoracic and abdominal wall motion, anterior tibialis EMG, snore microphone, electrocardiogram, and pulse oximetry.  MEDICATIONS Patient's medications include: Allopurinol, Atorvastatin, ASA, Lasix, MSIR, Naproxen, Miralax. Medications self-administered by patient during sleep study : No sleep medicine administered.  SLEEP ARCHITECTURE The study was initiated at 10:05:04 PM and ended at 5:12:53 AM. Sleep onset time was 1.7 minutes and the sleep efficiency was 83.4%. The total sleep time was 357.0 minutes. Stage REM latency was 80.0 minutes. The patient spent 12.04% of the night in stage N1 sleep, 73.39% in stage N2 sleep, 0.00% in stage N3 and 14.57% in REM. Alpha intrusion was absent. Supine sleep was 30.26%.  RESPIRATORY PARAMETERS The overall apnea/hypopnea index (AHI) was 7.9 per hour. There were 14 total apneas, including 1 obstructive, 13 central and 0 mixed apneas. There were 33 hypopneas and 40 RERAs. The AHI during Stage REM sleep was 25.4 per hour. AHI while supine was 16.7 per hour. The mean oxygen saturation was 95.49%. The minimum SpO2 during sleep was 88.00%. Soft snoring was noted during this  study.  CARDIAC DATA The 2 lead EKG demonstrated sinus rhythm. The mean heart rate was 62.23 beats per minute. Other EKG findings include: None.  LEG MOVEMENT DATA The total PLMS were 0 with a resulting PLMS index of 0.00. Associated arousal with leg movement index was 0.0 .  IMPRESSIONS - Mild obstructive sleep apnea occurred during this study (AHI = 7.9/h). - No significant central sleep apnea occurred during this study (CAI = 2.2/h). - Mild oxygen desaturation was noted during this study (Min O2 = 88.00%). - The patient snored with Soft snoring volume. - Clinically significant periodic limb movements did not occur during sleep. No significant associated arousals.  DIAGNOSIS - Obstructive Sleep Apnea (327.23 [G47.33 ICD-10])  RECOMMENDATIONS - Positional therapy avoiding supine position during sleep. - Very mild obstructive sleep apnea. Treatment options also include consideration of referral to ENT for evaluation of surgical causes of sleep apnea or referral to dentistry for fitting for oral device.  - Avoid alcohol, sedatives and other CNS depressants that may worsen sleep apnea and disrupt normal sleep architecture. - Sleep hygiene should be reviewed to assess factors that may improve sleep quality. - Weight management and regular exercise should be initiated or continued if appropriate. - Return to sleep clinic to discuss treatment options.    Quintella Reichert Diplomate, American Board of Sleep Medicine  ELECTRONICALLY SIGNED ON:  02/10/2015, 10:26 PM [facility] PH: [phone]   FX: [phone] ACCREDITED BY THE AMERICAN ACADEMY OF SLEEP MEDICINE

## 2015-02-10 NOTE — Telephone Encounter (Signed)
Please let patient know that they have mild sleep apnea and have patient followup in clinic to discuss result

## 2015-02-15 NOTE — Telephone Encounter (Signed)
Patient informed of information. Stated verbal understanding.  Appointment has been made for 05/02/15 @ 3:45pm for patient to come in to discuss treatment options.

## 2015-03-07 ENCOUNTER — Telehealth: Payer: Self-pay | Admitting: *Deleted

## 2015-03-11 ENCOUNTER — Ambulatory Visit: Payer: 59 | Admitting: Thoracic Surgery (Cardiothoracic Vascular Surgery)

## 2015-03-27 ENCOUNTER — Encounter: Payer: Self-pay | Admitting: Cardiology

## 2015-04-10 ENCOUNTER — Encounter: Payer: Self-pay | Admitting: Cardiology

## 2015-05-01 ENCOUNTER — Ambulatory Visit: Payer: 59 | Admitting: Cardiology

## 2015-05-02 ENCOUNTER — Ambulatory Visit: Payer: 59 | Admitting: Cardiology

## 2015-05-10 ENCOUNTER — Ambulatory Visit: Payer: 59 | Admitting: Cardiology

## 2015-06-10 ENCOUNTER — Encounter: Payer: Self-pay | Admitting: Cardiology

## 2015-06-10 ENCOUNTER — Encounter: Payer: Self-pay | Admitting: *Deleted

## 2015-06-10 ENCOUNTER — Ambulatory Visit (INDEPENDENT_AMBULATORY_CARE_PROVIDER_SITE_OTHER): Payer: 59 | Admitting: Cardiology

## 2015-06-10 VITALS — BP 110/70 | HR 59 | Ht 78.0 in | Wt 292.6 lb

## 2015-06-10 DIAGNOSIS — I5031 Acute diastolic (congestive) heart failure: Secondary | ICD-10-CM

## 2015-06-10 DIAGNOSIS — I2510 Atherosclerotic heart disease of native coronary artery without angina pectoris: Secondary | ICD-10-CM

## 2015-06-10 DIAGNOSIS — E785 Hyperlipidemia, unspecified: Secondary | ICD-10-CM | POA: Diagnosis not present

## 2015-06-10 DIAGNOSIS — Z951 Presence of aortocoronary bypass graft: Secondary | ICD-10-CM

## 2015-06-10 DIAGNOSIS — I10 Essential (primary) hypertension: Secondary | ICD-10-CM

## 2015-06-10 DIAGNOSIS — R002 Palpitations: Secondary | ICD-10-CM

## 2015-06-10 DIAGNOSIS — I2583 Coronary atherosclerosis due to lipid rich plaque: Secondary | ICD-10-CM
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
## 2015-06-10 NOTE — Patient Instructions (Signed)
Medication Instructions:   Your physician recommends that you continue on your current medications as directed. Please refer to the Current Medication list given to you today.   Labwork:  PRIOR TO YOUR 6 MONTH FOLLOW-UP APPOINTMENT WITH DR NELSON TO CHECK ---CMET, AND LIPIDS--PLEASE COME FASTING TO THIS LAB APPOINTMENT    Testing/Procedures:  Your physician has recommended that you wear a 48 HOUR holter monitor. Holter monitors are medical devices that record the heart's electrical activity. Doctors most often use these monitors to diagnose arrhythmias. Arrhythmias are problems with the speed or rhythm of the heartbeat. The monitor is a small, portable device. You can wear one while you do your normal daily activities. This is usually used to diagnose what is causing palpitations/syncope (passing out).    Follow-Up:  Your physician wants you to follow-up in: 6 MONTHS WITH DR Johnell Comings will receive a reminder letter in the mail two months in advance. If you don't receive a letter, please call our office to schedule the follow-up appointment.  PLEASE HAVE YOUR LABS DONE PRIOR TO THIS APPOINTMENT         If you need a refill on your cardiac medications before your next appointment, please call your pharmacy.

## 2015-06-20 ENCOUNTER — Telehealth: Payer: Self-pay | Admitting: Cardiology

## 2015-06-20 NOTE — Telephone Encounter (Signed)
New message     The pt was wanting to know what Ct on 5/517 said and to let the nurse he cancelled his appointment for the monitor on Friday he states it was a money issue

## 2015-06-20 NOTE — Telephone Encounter (Signed)
Attempted to call the pt back an no answer, and no VM set-up.

## 2015-06-20 NOTE — Telephone Encounter (Signed)
His EKG from maintaining normal, it's okay didn't get the Holter monitor I would just continue the same management.

## 2015-06-20 NOTE — Telephone Encounter (Signed)
Pt is calling in to ask what his EKG read from his OV with Dr Delton See on 06/10/15.  Informed the pt that it read bradycardia at a rate of 59, and Dr Delton See signed it off stating no change.  Pt states that he cancelled his appt to get his 48 hour holter monitor, for he states the cost is too much, and his finances aren't available right now to have this done.  Pt states he's asymptomatic and he hasn't had any episodes of palpitations, so he felt that he didn't need to pay the money to have a holter placed on, when he can't afford it at this time.  Informed the pt that I will make Dr Delton See aware of his decision to cancel his holter monitor appt, and I will follow-up with him if she has any further recommendations.  Pt verbalized understanding and agrees with this plan.

## 2015-06-20 NOTE — Telephone Encounter (Signed)
Notified the pt that per Dr Delton See, his ekg from the last OV was maintaining normal, and its okay that he didn't get the holter monitor, and she recommends that he just continue the same management.  Pt verbalized understanding and agrees with this plan.

## 2015-07-12 ENCOUNTER — Ambulatory Visit (INDEPENDENT_AMBULATORY_CARE_PROVIDER_SITE_OTHER): Payer: 59

## 2015-07-12 DIAGNOSIS — E785 Hyperlipidemia, unspecified: Secondary | ICD-10-CM

## 2015-07-12 DIAGNOSIS — I10 Essential (primary) hypertension: Secondary | ICD-10-CM | POA: Diagnosis not present

## 2015-07-12 DIAGNOSIS — R002 Palpitations: Secondary | ICD-10-CM

## 2015-10-16 ENCOUNTER — Other Ambulatory Visit: Payer: Self-pay | Admitting: *Deleted

## 2015-10-16 MED ORDER — ATORVASTATIN CALCIUM 20 MG PO TABS: 20.0000 mg | ORAL_TABLET | Freq: Every day | ORAL | 2 refills | Status: AC

## 2016-06-24 ENCOUNTER — Telehealth: Payer: Self-pay | Admitting: Cardiology

## 2016-06-24 NOTE — Telephone Encounter (Signed)
Patient calling, states that he was on pain medication for 7 years and he took himself off of the medication. Patient states that his heart rate has been high and happens in the morning but states that it seems to happen more frequently at night. Patient states that he was advised to let Dr. Delton See know that he is having this issues. Patient works in [employer] and will have a break at 3:00pm and asks if he you could call him around that time because his workplace is noisy. Thanks.

## 2016-06-24 NOTE — Telephone Encounter (Signed)
Pt is calling to let Dr Delton See know that over 17 days ago, he stopped taking prescribed Morphine for chronic pain.   Pt states he took himself off of this med and went to his PCP, who prescribed him clonazepam to take, to help with withdrawals.   Pt states that ever since he stopped taking his pain med, he has noticed at night time his heart races.  Pt states that he's ok during the day, but night time is when he feels his heart race and experiences palpitations.   Asked the pt if he is taking his prescribed clonazepam as ordered, for his withdrawals?  Asked if he was taking this at hour of sleep?  Per the pt, he reports taking this very seldomly, for he prefers not taking any sedative if possible.  Pt voiced no complaints of chest pain, sob, DOE, dizziness, pre-syncopal or syncopal episodes.  Pt reports no N/V or diaphoresis.   Pt states he told his PCP about his heart racing at night, and his PCP advised him to follow-up with his cardiologist for this.   Advised the pt that he should utilize his prescribed anti-anxiety meds, especially at night, to help with his withdrawals.  Advised the pt to avoid caffeine or any other stimulants at this time.  Advised the pt to increase his fluid intake by drinking water.   Informed the pt that I will route this message to Dr Delton See for further review and recommendation, and follow-up with the pt thereafter.  Pt verbalized understanding and agrees with this plan.

## 2016-06-25 NOTE — Telephone Encounter (Signed)
Spoke with the pt and informed him of Dr Lindaann Slough recommendations for him to continue clonazepam as advised by his PCP, and she recommended that we could order a 24 hour holter monitor to evaluate for arrhythmias, if he's interested in this. Pt states that he took his clonazepam last night, as I instructed him too, and he states "that's the best I've slept in 17 nights." Pt states that following recommendations with taking this med as prescribed, has decreased his symptoms.  Pt states that he will continue on this regimen and will notify our office if  issues with his heart arise.  Pt states if his heart starts racing again, he will call us back to request for the 24 hour holter to be placed then.  Pt verbalized understanding and agrees with this plan.  Pt more than appreciative for all the advise and assistance provided.

## 2016-06-25 NOTE — Telephone Encounter (Signed)
Continue clonazepam as advised by his primary care physician, we can order 24-hour Holter monitor to evaluate for arrhythmias if patient interested.

## 2017-06-28 ENCOUNTER — Emergency Department (HOSPITAL_BASED_OUTPATIENT_CLINIC_OR_DEPARTMENT_OTHER)
Admission: EM | Admit: 2017-06-28 | Discharge: 2017-06-28 | Disposition: A | Discharge status: Home / Self Care | Payer: 59 | Attending: Emergency Medicine | Admitting: Emergency Medicine

## 2017-06-28 ENCOUNTER — Other Ambulatory Visit: Payer: Self-pay

## 2017-06-28 ENCOUNTER — Encounter (HOSPITAL_BASED_OUTPATIENT_CLINIC_OR_DEPARTMENT_OTHER): Payer: Self-pay

## 2017-06-28 DIAGNOSIS — I2510 Atherosclerotic heart disease of native coronary artery without angina pectoris: Secondary | ICD-10-CM | POA: Insufficient documentation

## 2017-06-28 DIAGNOSIS — Z7982 Long term (current) use of aspirin: Secondary | ICD-10-CM | POA: Insufficient documentation

## 2017-06-28 DIAGNOSIS — Y999 Unspecified external cause status: Secondary | ICD-10-CM | POA: Diagnosis not present

## 2017-06-28 DIAGNOSIS — T1592XA Foreign body on external eye, part unspecified, left eye, initial encounter: Secondary | ICD-10-CM | POA: Diagnosis not present

## 2017-06-28 DIAGNOSIS — I5032 Chronic diastolic (congestive) heart failure: Secondary | ICD-10-CM | POA: Diagnosis not present

## 2017-06-28 DIAGNOSIS — Y929 Unspecified place or not applicable: Secondary | ICD-10-CM | POA: Insufficient documentation

## 2017-06-28 DIAGNOSIS — X58XXXA Exposure to other specified factors, initial encounter: Secondary | ICD-10-CM | POA: Diagnosis not present

## 2017-06-28 DIAGNOSIS — H11432 Conjunctival hyperemia, left eye: Secondary | ICD-10-CM | POA: Diagnosis present

## 2017-06-28 DIAGNOSIS — Z79899 Other long term (current) drug therapy: Secondary | ICD-10-CM | POA: Insufficient documentation

## 2017-06-28 DIAGNOSIS — I110 Hypertensive heart disease with heart failure: Secondary | ICD-10-CM | POA: Insufficient documentation

## 2017-06-28 DIAGNOSIS — Y9389 Activity, other specified: Secondary | ICD-10-CM | POA: Insufficient documentation

## 2017-06-28 MED ORDER — CIPROFLOXACIN HCL 0.3 % OP SOLN: 1.0000 [drp] | Freq: Four times a day (QID) | OPHTHALMIC | 0 refills | Status: DC

## 2017-06-28 MED ORDER — TETRACAINE HCL 0.5 % OP SOLN
1.0000 [drp] | Freq: Once | OPHTHALMIC | Status: DC
  Filled 2017-06-28: qty 4

## 2017-06-28 MED ORDER — CIPROFLOXACIN HCL 0.3 % OP SOLN
1.0000 [drp] | Freq: Once | OPHTHALMIC | Status: AC
  Administered 2017-06-28: 1 [drp] via OPHTHALMIC
  Filled 2017-06-28: qty 2.5

## 2017-06-28 MED ORDER — FLUORESCEIN SODIUM 1 MG OP STRP
1.0000 | ORAL_STRIP | Freq: Once | OPHTHALMIC | Status: DC
Start: 2017-06-28 — End: 2017-06-28
  Filled 2017-06-28: qty 1
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
## 2017-06-28 NOTE — ED Triage Notes (Signed)
C/o sawdust in left eye approx 2 hours PTA-states he flushed eye PTA-cont'd pain, redness-NAD-steady gait

## 2017-06-28 NOTE — Discharge Instructions (Signed)
Use the eyedrops as directed.  Follow-up with the referred ophthalmologist.  Call his office tomorrow and they will see you tomorrow for further evaluation.  Return to the emergency department for any worsening pain, fevers, vision changes or any other worsening or concerning symptoms.

## 2017-06-28 NOTE — ED Notes (Signed)
Pt used store bought eye wash but reports no improvement. Pt reports blurred vision and irritation. "feels better when my eye is closed"

## 2018-06-30 ENCOUNTER — Telehealth: Payer: Self-pay

## 2018-06-30 NOTE — Telephone Encounter (Signed)
Virtual Visit Pre-Appointment Phone Call  "(Name), I am calling you today to discuss your upcoming appointment. We are currently trying to limit exposure to the virus that causes COVID-19 by seeing patients at home rather than in the office."  1. "What is the BEST phone number to call the day of the visit?" - include this in appointment notes  2. "Do you have or have access to (through a family member/friend) a smartphone with video capability that we can use for your visit?" a. If yes - list this number in appt notes as "cell" (if different from BEST phone #) and list the appointment type as a VIDEO visit in appointment notes b. If no - list the appointment type as a PHONE visit in appointment notes  Confirm consent - "In the setting of the current Covid19 crisis, you are scheduled for a (phone or video) visit with your provider on (date) at (time).  Just as we do with many in-office visits, in order for you to participate in this visit, we must obtain consent.  If you'd like, I can send this to your mychart (if signed up) or email for you to review.  Otherwise, I can obtain your verbal consent now.  All virtual visits are billed to your insurance company just like a normal visit would be.  By agreeing to a virtual visit, we'd like you to understand that the technology does not allow for your provider to perform an examination, and thus may limit your provider's ability to fully assess your condition. If your provider identifies any concerns that need to be evaluated in person, we will make arrangements to do so.  Finally, though the technology is pretty good, we cannot assure that it will always work on either your or our end, and in the setting of a video visit, we may have to convert it to a phone-only visit.  In either situation, we cannot ensure that we have a secure connection.  Are you willing to proceed?" YES 3. Advise patient to be prepared - "Two hours prior to your appointment, go ahead  and check your blood pressure, pulse, oxygen saturation, and your weight (if you have the equipment to check those) and write them all down. When your visit starts, your provider will ask you for this information. If you have an Apple Watch or Kardia device, please plan to have heart rate information ready on the day of your appointment. Please have a pen and paper handy nearby the day of the visit as well."  4. Give patient instructions for MyChart download to smartphone OR [email or web address] as below if video visit (depending on what platform provider is using)  5. Inform patient they will receive a phone call 15 minutes prior to their appointment time (may be from unknown caller ID) so they should be prepared to answer    TELEPHONE CALL NOTE  Jarmarcus Ysaguirre Fawaz has been deemed a candidate for a follow-up tele-health visit to limit community exposure during the Covid-19 pandemic. I spoke with the patient via phone to ensure availability of phone/video source, confirm preferred email & phone number, and discuss instructions and expectations.  I reminded Dontai Tilley Borelli to be prepared with any vital sign and/or heart rhythm information that could potentially be obtained via home monitoring, at the time of his visit. I reminded Audrey Sudbury Belzer to expect a phone call prior to his visit.  Jacqlyn Krauss, [facility] 06/30/2018 9:16 AM   INSTRUCTIONS FOR DOWNLOADING  THE MYCHART APP TO SMARTPHONE  - The patient must first make sure to have activated MyChart and know their login information - If Apple, go to [employer] and type in MyChart in the search bar and download the app. If Android, ask patient to go to [employer] and type in [place] in the search bar and download the app. The app is free but as with any other app downloads, their phone may require them to verify saved payment information or Apple/Android password.  - The patient will need to then log into the app with their MyChart username  and password, and select [facility] as their healthcare provider to link the account. When it is time for your visit, go to the MyChart app, find appointments, and click Begin Video Visit. Be sure to Select Allow for your device to access the Microphone and Camera for your visit. You will then be connected, and your provider will be with you shortly.  **If they have any issues connecting, or need assistance please contact MyChart service desk [phone])**  **If using a computer, in order to ensure the best quality for their visit they will need to use either of the following Internet Browsers: [employer], or Google Chrome**  IF USING DOXIMITY or DOXY.ME - The patient will receive a link just prior to their visit by text.     FULL LENGTH CONSENT FOR TELE-HEALTH VISIT   I hereby voluntarily request, consent and authorize [facility] and its employed or contracted physicians, physician assistants, nurse practitioners or other licensed health care professionals (the Practitioner), to provide me with telemedicine health care services (the "Services") as deemed necessary by the treating Practitioner. I acknowledge and consent to receive the Services by the Practitioner via telemedicine. I understand that the telemedicine visit will involve communicating with the Practitioner through live audiovisual communication technology and the disclosure of certain medical information by electronic transmission. I acknowledge that I have been given the opportunity to request an in-person assessment or other available alternative prior to the telemedicine visit and am voluntarily participating in the telemedicine visit.  I understand that I have the right to withhold or withdraw my consent to the use of telemedicine in the course of my care at any time, without affecting my right to future care or treatment, and that the Practitioner or I may terminate the telemedicine visit at any time. I  understand that I have the right to inspect all information obtained and/or recorded in the course of the telemedicine visit and may receive copies of available information for a reasonable fee.  I understand that some of the potential risks of receiving the Services via telemedicine include:  Marland Kitchen Delay or interruption in medical evaluation due to technological equipment failure or disruption; . Information transmitted may not be sufficient (e.g. poor resolution of images) to allow for appropriate medical decision making by the Practitioner; and/or  . In rare instances, security protocols could fail, causing a breach of personal health information.  Furthermore, I acknowledge that it is my responsibility to provide information about my medical history, conditions and care that is complete and accurate to the best of my ability. I acknowledge that Practitioner's advice, recommendations, and/or decision may be based on factors not within their control, such as incomplete or inaccurate data provided by me or distortions of diagnostic images or specimens that may result from electronic transmissions. I understand that the practice of medicine is not an exact science and that Practitioner  makes no warranties or guarantees regarding treatment outcomes. I acknowledge that I will receive a copy of this consent concurrently upon execution via email to the email address I last provided but may also request a printed copy by calling the office of [facility].    I understand that my insurance will be billed for this visit.   I have read or had this consent read to me. . I understand the contents of this consent, which adequately explains the benefits and risks of the Services being provided via telemedicine.  . I have been provided ample opportunity to ask questions regarding this consent and the Services and have had my questions answered to my satisfaction. . I give my informed consent for the services to be  provided through the use of telemedicine in my medical care  By participating in this telemedicine visit I agree to the above.
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
## 2018-07-04 ENCOUNTER — Other Ambulatory Visit: Payer: Self-pay

## 2018-07-04 ENCOUNTER — Telehealth (INDEPENDENT_AMBULATORY_CARE_PROVIDER_SITE_OTHER): Payer: BLUE CROSS/BLUE SHIELD | Admitting: Cardiology

## 2018-07-04 ENCOUNTER — Encounter: Payer: Self-pay | Admitting: Cardiology

## 2018-07-04 VITALS — BP 133/88 | HR 59 | Ht 79.0 in | Wt 290.0 lb

## 2018-07-04 DIAGNOSIS — R0602 Shortness of breath: Secondary | ICD-10-CM

## 2018-07-04 DIAGNOSIS — R079 Chest pain, unspecified: Secondary | ICD-10-CM

## 2018-07-04 DIAGNOSIS — I2510 Atherosclerotic heart disease of native coronary artery without angina pectoris: Secondary | ICD-10-CM | POA: Diagnosis not present

## 2018-07-04 DIAGNOSIS — I2583 Coronary atherosclerosis due to lipid rich plaque: Secondary | ICD-10-CM

## 2018-07-04 DIAGNOSIS — R002 Palpitations: Secondary | ICD-10-CM

## 2018-07-04 MED ORDER — ASPIRIN EC 81 MG PO TBEC: 81.0000 mg | DELAYED_RELEASE_TABLET | Freq: Every day | ORAL | 3 refills | Status: AC

## 2018-07-04 NOTE — Patient Instructions (Addendum)
Medication Instructions:  Your physician has recommended you make the following change in your medication:   1. DECREASE ASPIRIN TO 81 MG DAILY.  If you need a refill on your cardiac medications before your next appointment, please call your pharmacy.   Lab work: TO BE DONE: BMET, CBC, LIPIDS, LFTS  If you have labs (blood work) drawn today and your tests are completely normal, you will receive your results only by: Marland Kitchen MyChart Message (if you have MyChart) OR . A paper copy in the mail If you have any lab test that is abnormal or we need to change your treatment, we will call you to review the results.  Testing/Procedures: Your physician has recommended that you wear a ZIO LONG TERM-holter monitor. Holter monitors are medical devices that record the heart's electrical activity. Doctors most often use these monitors to diagnose arrhythmias. Arrhythmias are problems with the speed or rhythm of the heartbeat. The monitor is a small, portable device. You can wear one while you do your normal daily activities. This is usually used to diagnose what is causing palpitations/syncope (passing out).    Follow-Up: At [facility], you and your health needs are our priority.  As part of our continuing mission to provide you with exceptional heart care, we have created designated Provider Care Teams.  These Care Teams include your primary Cardiologist (physician) and Advanced Practice Providers (APPs -  Physician Assistants and Nurse Practitioners) who all work together to provide you with the care you need, when you need it. You will need a follow up appointment in 3 months.  Please call our office 2 months in advance to schedule this appointment.  You may see Tobias Alexander, MD or one of the following Advanced Practice Providers on your designated Care Team:   [place], PA-C Ronie Spies, PA-C . Jacolyn Reedy, PA-C

## 2018-07-04 NOTE — Addendum Note (Signed)
Addended by: Michaelle Copas on: 07/04/2018 04:25 PM   Modules accepted: Orders

## 2018-07-07 ENCOUNTER — Telehealth: Payer: Self-pay | Admitting: *Deleted

## 2018-07-07 NOTE — Addendum Note (Signed)
Addended by: Michaelle Copas on: 07/07/2018 04:07 PM   Modules accepted: Orders

## 2018-07-07 NOTE — Telephone Encounter (Signed)
Patient called regarding long term holter monitor order.  Patient concerned about out of pocket cost.  Patient enrolled for [employer] to ship a 14 day long term holter monitor to his home, (out of pocket cost less than ZIO),  Patient will call [employer] at [phone] to receive quote on exact out of pocket cost after enrolled and [employer] has insurance details.  Patient given brief instructions for monitor as they will be included in the monitor kit.

## 2018-07-08 ENCOUNTER — Telehealth: Payer: Self-pay

## 2018-07-08 NOTE — Telephone Encounter (Signed)
    COVID-19 Pre-Screening Questions:  . In the past 7 to 10 days have you had a cough,  shortness of breath, headache, congestion, fever (100 or greater) body aches, chills, sore throat, or sudden loss of taste or sense of smell? . Have you been around anyone with known Covid 19. . Have you been around anyone who is awaiting Covid 19 test results in the past 7 to 10 days? . Have you been around anyone who has been exposed to Covid 19, or has mentioned symptoms of Covid 19 within the past 7 to 10 days?  If you have any concerns/questions about symptoms patients report during screening (either on the phone or at threshold). Contact the provider seeing the patient or DOD for further guidance.  If neither are available contact a member of the leadership team.          No answer no message left mailbox full [phone] 07/08/2018

## 2018-07-11 ENCOUNTER — Other Ambulatory Visit: Payer: Self-pay

## 2018-07-11 ENCOUNTER — Other Ambulatory Visit: Payer: BLUE CROSS/BLUE SHIELD | Admitting: *Deleted

## 2018-07-11 DIAGNOSIS — I2510 Atherosclerotic heart disease of native coronary artery without angina pectoris: Secondary | ICD-10-CM

## 2018-07-11 LAB — CBC
Hematocrit: 45.6 % (ref 37.5–51.0)
Hemoglobin: 15.9 g/dL (ref 13.0–17.7)
MCH: 33.6 pg — ABNORMAL HIGH (ref 26.6–33.0)
MCHC: 34.9 g/dL (ref 31.5–35.7)
MCV: 96 fL (ref 79–97)
Platelets: 206 10*3/uL (ref 150–450)
RBC: 4.73 x10E6/uL (ref 4.14–5.80)
RDW: 12.5 % (ref 11.6–15.4)
WBC: 7.7 10*3/uL (ref 3.4–10.8)

## 2018-07-11 LAB — BASIC METABOLIC PANEL
BUN/Creatinine Ratio: 16 (ref 9–20)
BUN: 18 mg/dL (ref 6–24)
CO2: 21 mmol/L (ref 20–29)
Calcium: 9.5 mg/dL (ref 8.7–10.2)
Chloride: 103 mmol/L (ref 96–106)
Creatinine, Ser: 1.11 mg/dL (ref 0.76–1.27)
GFR calc Af Amer: 88 mL/min/{1.73_m2} (ref >59)
GFR calc non Af Amer: 76 mL/min/{1.73_m2} (ref >59)
Glucose: 94 mg/dL (ref 65–99)
Potassium: 4.6 mmol/L (ref 3.5–5.2)
Sodium: 139 mmol/L (ref 134–144)

## 2018-07-11 LAB — HEPATIC FUNCTION PANEL
ALT: 38 IU/L (ref 0–44)
AST: 24 IU/L (ref 0–40)
Albumin: 4.3 g/dL (ref 3.8–4.9)
Alkaline Phosphatase: 82 IU/L (ref 39–117)
Bilirubin Total: 0.5 mg/dL (ref 0.0–1.2)
Bilirubin, Direct: 0.13 mg/dL (ref 0.00–0.40)
Total Protein: 7 g/dL (ref 6.0–8.5)

## 2018-07-11 LAB — LIPID PANEL
Chol/HDL Ratio: 3.6 ratio (ref 0.0–5.0)
Cholesterol, Total: 146 mg/dL (ref 100–199)
HDL: 41 mg/dL (ref >39)
LDL Calculated: 81 mg/dL (ref 0–99)
Triglycerides: 122 mg/dL (ref 0–149)
VLDL Cholesterol Cal: 24 mg/dL (ref 5–40)

## 2018-07-13 ENCOUNTER — Ambulatory Visit (INDEPENDENT_AMBULATORY_CARE_PROVIDER_SITE_OTHER): Payer: BLUE CROSS/BLUE SHIELD

## 2018-07-13 DIAGNOSIS — R42 Dizziness and giddiness: Secondary | ICD-10-CM | POA: Diagnosis not present

## 2018-07-13 DIAGNOSIS — R002 Palpitations: Secondary | ICD-10-CM | POA: Diagnosis not present

## 2018-07-13 DIAGNOSIS — R0602 Shortness of breath: Secondary | ICD-10-CM

## 2018-07-13 DIAGNOSIS — R079 Chest pain, unspecified: Secondary | ICD-10-CM | POA: Diagnosis not present

## 2018-08-09 ENCOUNTER — Other Ambulatory Visit: Payer: Self-pay | Admitting: Cardiology

## 2018-08-09 ENCOUNTER — Other Ambulatory Visit: Payer: Self-pay

## 2018-08-09 DIAGNOSIS — R002 Palpitations: Secondary | ICD-10-CM

## 2018-08-09 DIAGNOSIS — R42 Dizziness and giddiness: Secondary | ICD-10-CM

## 2018-08-09 DIAGNOSIS — R079 Chest pain, unspecified: Secondary | ICD-10-CM

## 2018-08-09 DIAGNOSIS — R0602 Shortness of breath: Secondary | ICD-10-CM

## 2018-10-22 ENCOUNTER — Encounter (HOSPITAL_BASED_OUTPATIENT_CLINIC_OR_DEPARTMENT_OTHER): Payer: Self-pay | Admitting: *Deleted

## 2018-10-22 ENCOUNTER — Other Ambulatory Visit: Payer: Self-pay

## 2018-10-22 ENCOUNTER — Emergency Department (HOSPITAL_BASED_OUTPATIENT_CLINIC_OR_DEPARTMENT_OTHER)
Admission: EM | Admit: 2018-10-22 | Discharge: 2018-10-22 | Disposition: A | Discharge status: Home / Self Care | Payer: BLUE CROSS/BLUE SHIELD | Attending: Emergency Medicine | Admitting: Emergency Medicine

## 2018-10-22 ENCOUNTER — Emergency Department (HOSPITAL_BASED_OUTPATIENT_CLINIC_OR_DEPARTMENT_OTHER): Payer: BLUE CROSS/BLUE SHIELD

## 2018-10-22 DIAGNOSIS — I5033 Acute on chronic diastolic (congestive) heart failure: Secondary | ICD-10-CM | POA: Insufficient documentation

## 2018-10-22 DIAGNOSIS — L03311 Cellulitis of abdominal wall: Secondary | ICD-10-CM | POA: Diagnosis not present

## 2018-10-22 DIAGNOSIS — Z7982 Long term (current) use of aspirin: Secondary | ICD-10-CM | POA: Insufficient documentation

## 2018-10-22 DIAGNOSIS — I2510 Atherosclerotic heart disease of native coronary artery without angina pectoris: Secondary | ICD-10-CM | POA: Insufficient documentation

## 2018-10-22 DIAGNOSIS — I110 Hypertensive heart disease with heart failure: Secondary | ICD-10-CM | POA: Insufficient documentation

## 2018-10-22 DIAGNOSIS — Z79899 Other long term (current) drug therapy: Secondary | ICD-10-CM | POA: Diagnosis not present

## 2018-10-22 DIAGNOSIS — Z951 Presence of aortocoronary bypass graft: Secondary | ICD-10-CM | POA: Insufficient documentation

## 2018-10-22 DIAGNOSIS — I252 Old myocardial infarction: Secondary | ICD-10-CM | POA: Insufficient documentation

## 2018-10-22 DIAGNOSIS — Z9861 Coronary angioplasty status: Secondary | ICD-10-CM | POA: Insufficient documentation

## 2018-10-22 DIAGNOSIS — L989 Disorder of the skin and subcutaneous tissue, unspecified: Secondary | ICD-10-CM | POA: Diagnosis present

## 2018-10-22 LAB — CBC WITH DIFFERENTIAL/PLATELET
Abs Immature Granulocytes: 0.01 10*3/uL (ref 0.00–0.07)
Basophils Absolute: 0 10*3/uL (ref 0.0–0.1)
Basophils Relative: 1 %
Eosinophils Absolute: 0.1 10*3/uL (ref 0.0–0.5)
Eosinophils Relative: 2 %
HCT: 41.8 % (ref 39.0–52.0)
Hemoglobin: 14 g/dL (ref 13.0–17.0)
Immature Granulocytes: 0 %
Lymphocytes Relative: 24 %
Lymphs Abs: 1.6 10*3/uL (ref 0.7–4.0)
MCH: 32.9 pg (ref 26.0–34.0)
MCHC: 33.5 g/dL (ref 30.0–36.0)
MCV: 98.4 fL (ref 80.0–100.0)
Monocytes Absolute: 0.5 10*3/uL (ref 0.1–1.0)
Monocytes Relative: 8 %
Neutro Abs: 4.3 10*3/uL (ref 1.7–7.7)
Neutrophils Relative %: 65 %
Platelets: 169 10*3/uL (ref 150–400)
RBC: 4.25 MIL/uL (ref 4.22–5.81)
RDW: 11.9 % (ref 11.5–15.5)
WBC: 6.7 10*3/uL (ref 4.0–10.5)
nRBC: 0 % (ref 0.0–0.2)

## 2018-10-22 LAB — BASIC METABOLIC PANEL
Anion gap: 10 (ref 5–15)
BUN: 17 mg/dL (ref 6–20)
CO2: 23 mmol/L (ref 22–32)
Calcium: 8.7 mg/dL — ABNORMAL LOW (ref 8.9–10.3)
Chloride: 103 mmol/L (ref 98–111)
Creatinine, Ser: 1.1 mg/dL (ref 0.61–1.24)
GFR calc Af Amer: >60 mL/min (ref >60)
GFR calc non Af Amer: >60 mL/min (ref >60)
Glucose, Bld: 105 mg/dL — ABNORMAL HIGH (ref 70–99)
Potassium: 3.8 mmol/L (ref 3.5–5.1)
Sodium: 136 mmol/L (ref 135–145)

## 2018-10-22 MED ORDER — IOHEXOL 300 MG/ML  SOLN
100.0000 mL | Freq: Once | INTRAMUSCULAR | Status: AC | PRN
  Administered 2018-10-22: 100 mL via INTRAVENOUS

## 2018-10-22 MED ORDER — SULFAMETHOXAZOLE-TRIMETHOPRIM 800-160 MG PO TABS: 1.0000 | ORAL_TABLET | Freq: Two times a day (BID) | ORAL | 0 refills | Status: AC

## 2018-10-22 NOTE — ED Notes (Signed)
ED Provider at bedside. 

## 2018-10-22 NOTE — ED Notes (Signed)
Pt returned from CT °
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
## 2018-10-22 NOTE — Discharge Instructions (Signed)
Please read and follow all provided instructions.  Your diagnoses today include:  1. Cellulitis of abdominal wall     Tests performed today include:  Vital signs. See below for your results today.   Blood counts and electrolytes -normal white blood cell count (infection fighting cells)  CT scan of the abdomen -she has some inflammation in the abdominal wall consistent with cellulitis around the bellybutton, no deep abscess or fluid collections that need to be drained today  Medications prescribed:   Bactrim (trimethoprim/sulfamethoxazole) - antibiotic  You have been prescribed an antibiotic medicine: take the entire course of medicine even if you are feeling better. Stopping early can cause the antibiotic not to work.,  Take any prescribed medications only as directed.   Home care instructions:  Follow any educational materials contained in this packet. Keep affected area above the level of your heart when possible. Wash area gently twice a day with warm soapy water. Do not apply alcohol or hydrogen peroxide. Cover the area if it draining or weeping.   Follow-up instructions: See your doctor in the next 72 hours for recheck.  Return instructions:  Return to the Emergency Department if you have:  Fever  Worsening symptoms  Worsening pain  Worsening swelling  Redness of the skin that moves away from the affected area, especially if it streaks away from the affected area   Any other emergent concerns  Your vital signs today were: BP 127/86 (BP Location: Left Arm)    Pulse (!) 57    Temp 98.6 F (37 C) (Oral)    Resp 18    Ht 6\' 7"  (2.007 m)    Wt 131.5 kg    SpO2 100%    BMI 32.67 kg/m  If your blood pressure (BP) was elevated above 135/85 this visit, please have this repeated by your doctor within one month. --------------

## 2018-10-22 NOTE — ED Notes (Signed)
Patient transported to CT 

## 2020-12-07 IMAGING — CT CT ABD-PELV W/ CM
2 of 5 series · 16 of 46 positions shown, 18 images · IV contrast (APPLIED)
Comparison: None.

CLINICAL DATA: Abdominal soft tissue infection and suspected

EXAM:
CT ABDOMEN AND PELVIS WITH CONTRAST
TECHNIQUE: Multidetector CT imaging of the abdomen and pelvis was performed
using the standard protocol following bolus administration of
intravenous contrast.
CONTRAST:  100mL OMNIPAQUE IOHEXOL 300 MG/ML  SOLN

[Series 2: axial st · axial · 0.98mm/px · z∈[-551,-76]mm · 13 of 107 slices shown, 15 images]
[im 6/107  soft-tissue]
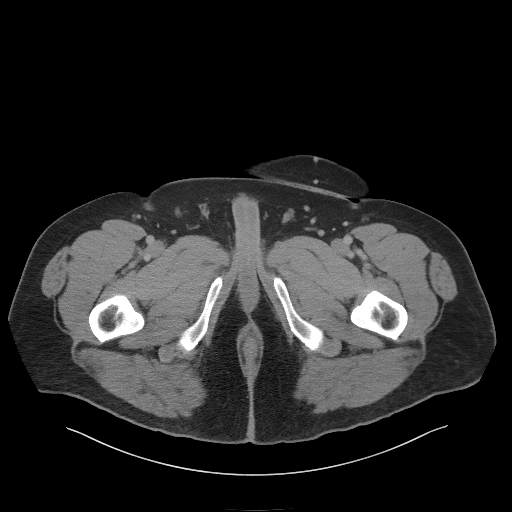
[im 6/107  bone]
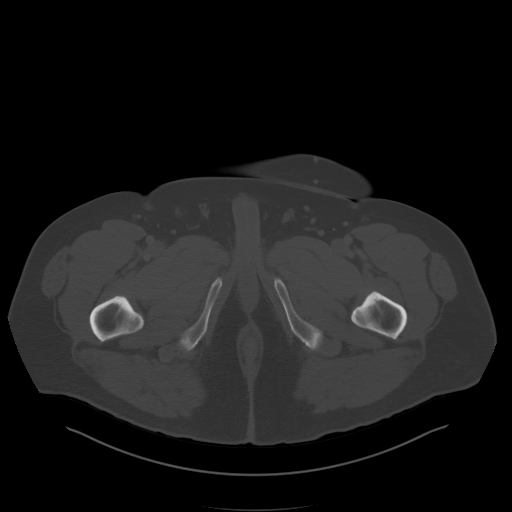
[im 16/107  soft-tissue]
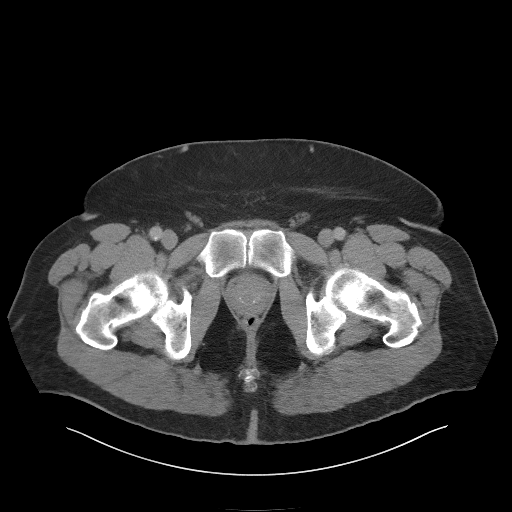
[im 22/107  soft-tissue]
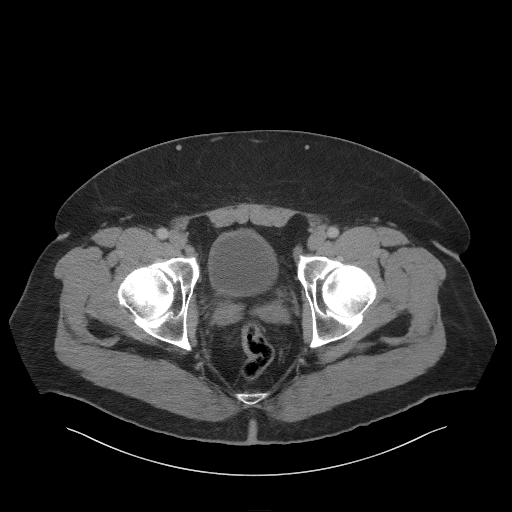
[im 32/107  soft-tissue]
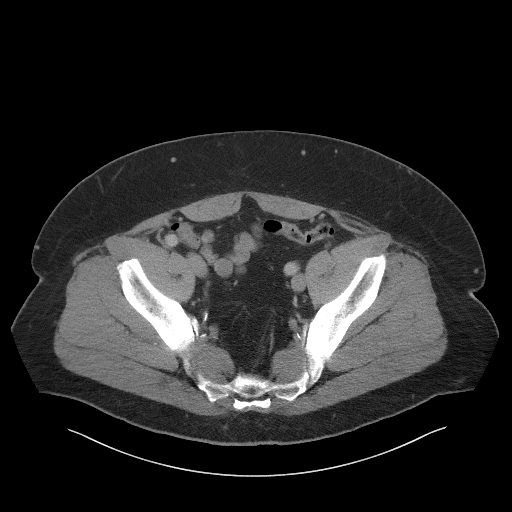
[im 38/107  soft-tissue]
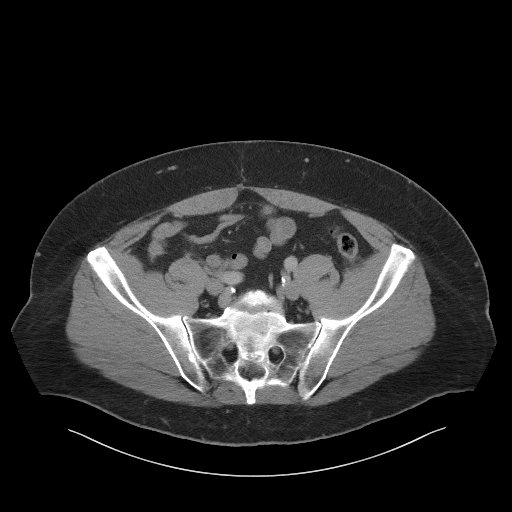
[im 48/107  soft-tissue]
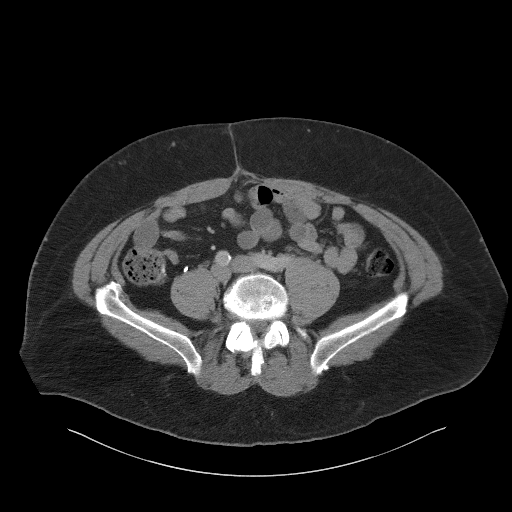
[im 54/107  soft-tissue]
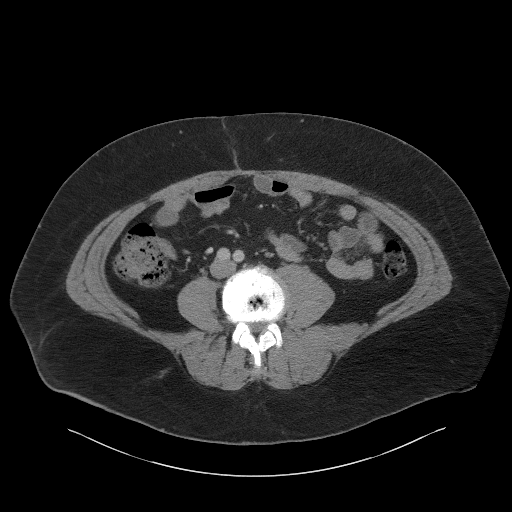
[im 59/107  soft-tissue]
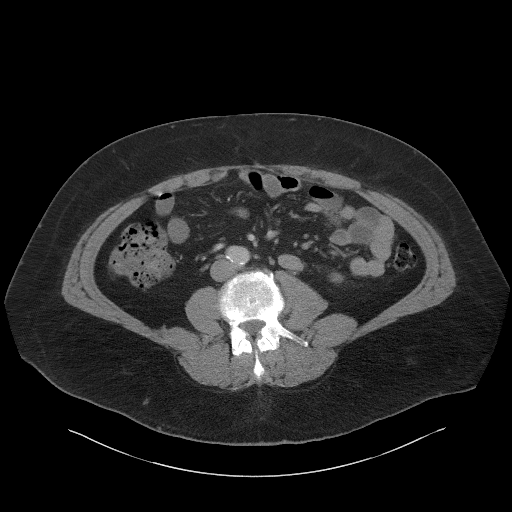
[im 69/107  soft-tissue]
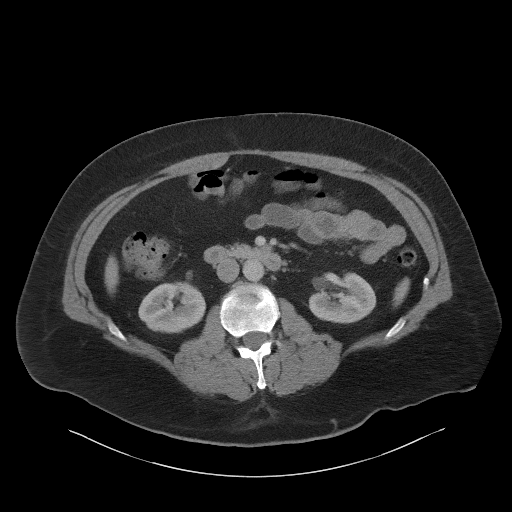
[im 69/107  bone]
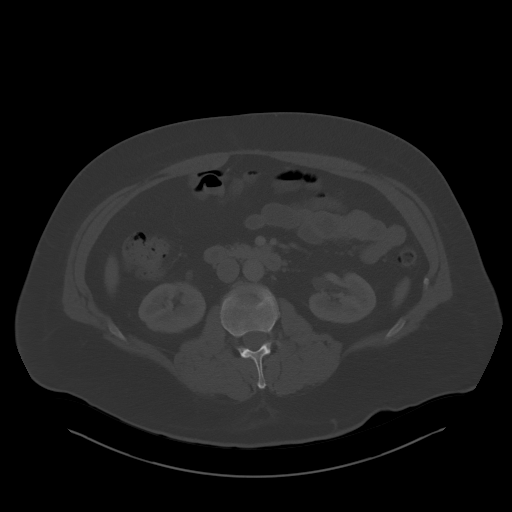
[im 75/107  soft-tissue]
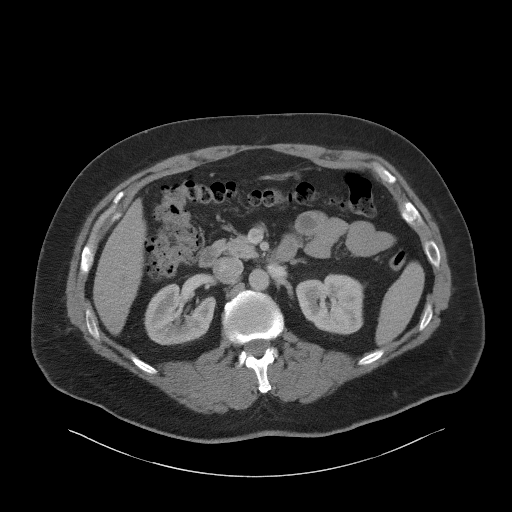
[im 85/107  soft-tissue]
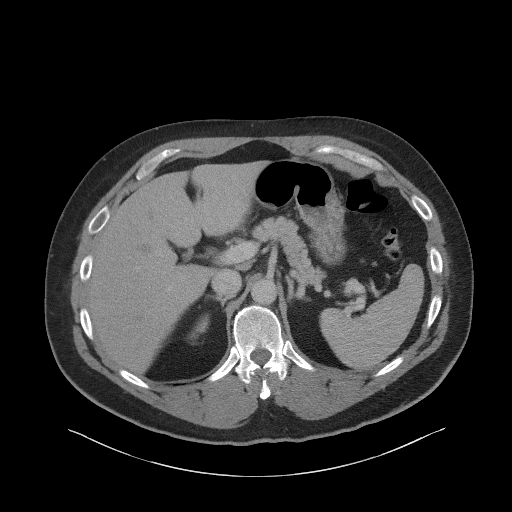
[im 91/107  soft-tissue]
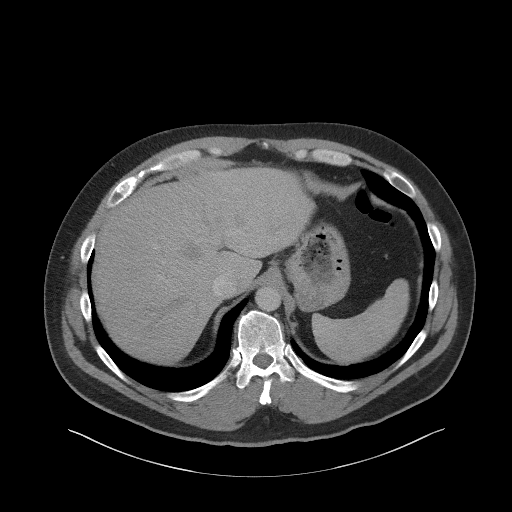
[im 101/107  soft-tissue]
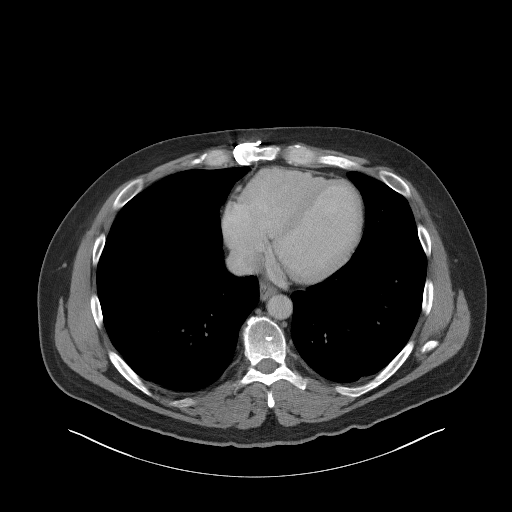

[Series 6: coronal st · coronal · 0.90mm/px · 3 of 111 slices shown]
[im 37/111  soft-tissue]
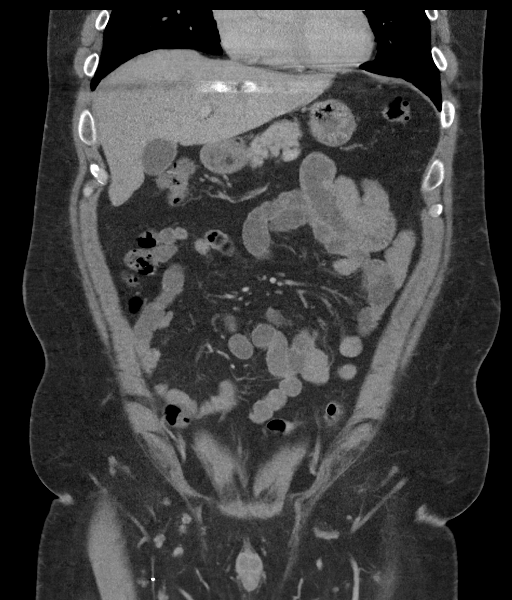
[im 49/111  soft-tissue]
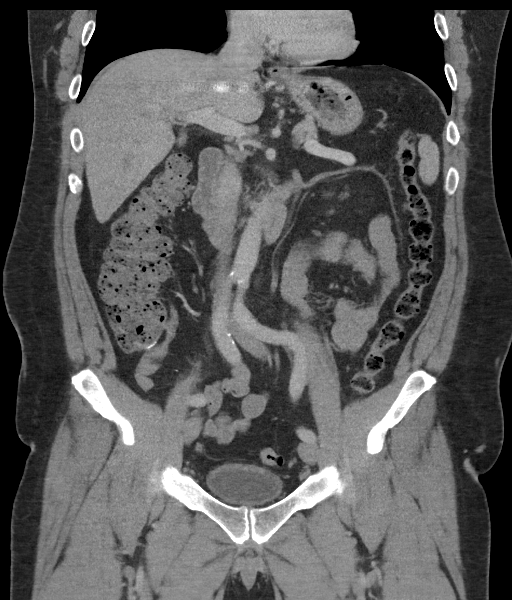
[im 62/111  soft-tissue]
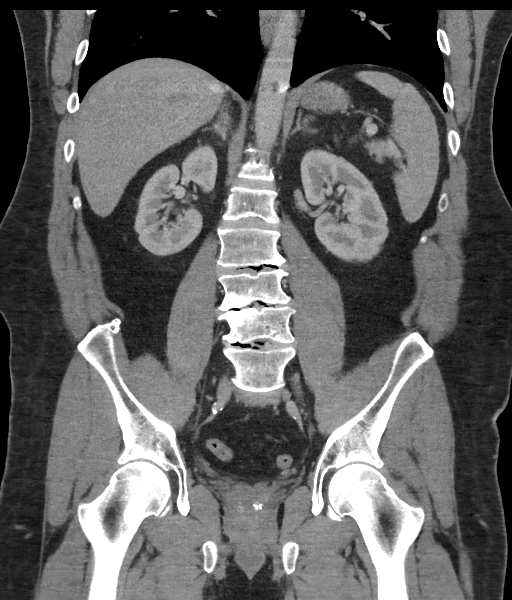

[16 of 46 positions shown; findings below may reference images not displayed]

FINDINGS: Lower chest: The visualized heart size within normal limits. No
pericardial fluid/thickening.

No hiatal hernia.

The visualized portions of the lungs are clear.

Hepatobiliary: The liver is normal in density without focal
abnormality.The main portal vein is patent. No evidence of calcified
gallstones, gallbladder wall thickening or biliary dilatation.

Pancreas: Unremarkable. No pancreatic ductal dilatation or
surrounding inflammatory changes.

Spleen: Normal in size without focal abnormality.

Adrenals/Urinary Tract: Both adrenal glands appear normal. The
kidneys and collecting system appear normal without evidence of
urinary tract calculus or hydronephrosis. Bladder is unremarkable.

Stomach/Bowel: The stomach and small bowel are normal in appearance.
There is a moderate amount of colonic stool.Surgical sutures seen in
the right lower quadrant.

Vascular/Lymphatic: There are no enlarged mesenteric,
retroperitoneal, or pelvic lymph nodes. Scattered aortic
atherosclerotic calcifications are seen without aneurysmal
dilatation.

Reproductive: The prostate is unremarkable.

Other: Along the anterior wall at the umbilicus there is mild
subcutaneous fat stranding changes and thickening along the
umbilicus tract. No loculated fluid collection or soft tissue mass.

Musculoskeletal: No acute or significant osseous findings.
IMPRESSION: Mild thickening and subcutaneous stranding changes seen along the
umbilical tract. No loculated abscess or other soft tissue mass.

## 2021-04-29 DIAGNOSIS — E785 Hyperlipidemia, unspecified: Secondary | ICD-10-CM | POA: Diagnosis not present

## 2021-04-29 DIAGNOSIS — Z0000 Encounter for general adult medical examination without abnormal findings: Secondary | ICD-10-CM | POA: Diagnosis not present

## 2021-04-29 DIAGNOSIS — I10 Essential (primary) hypertension: Secondary | ICD-10-CM | POA: Diagnosis not present

## 2021-04-29 DIAGNOSIS — Z125 Encounter for screening for malignant neoplasm of prostate: Secondary | ICD-10-CM | POA: Diagnosis not present

## 2021-04-29 DIAGNOSIS — F411 Generalized anxiety disorder: Secondary | ICD-10-CM | POA: Diagnosis not present

## 2021-04-29 DIAGNOSIS — M109 Gout, unspecified: Secondary | ICD-10-CM | POA: Diagnosis not present

## 2021-04-29 DIAGNOSIS — N529 Male erectile dysfunction, unspecified: Secondary | ICD-10-CM | POA: Diagnosis not present

## 2021-05-16 DIAGNOSIS — E291 Testicular hypofunction: Secondary | ICD-10-CM | POA: Diagnosis not present
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
## 2021-06-09 ENCOUNTER — Ambulatory Visit: Payer: BC Managed Care – PPO | Admitting: Cardiology

## 2021-06-09 ENCOUNTER — Encounter: Payer: Self-pay | Admitting: Cardiology

## 2021-06-09 VITALS — BP 130/82 | HR 59 | Ht 79.0 in | Wt 291.0 lb

## 2021-06-09 DIAGNOSIS — I2583 Coronary atherosclerosis due to lipid rich plaque: Secondary | ICD-10-CM

## 2021-06-09 DIAGNOSIS — I77810 Thoracic aortic ectasia: Secondary | ICD-10-CM | POA: Diagnosis not present

## 2021-06-09 DIAGNOSIS — R002 Palpitations: Secondary | ICD-10-CM | POA: Diagnosis not present

## 2021-06-09 DIAGNOSIS — I2510 Atherosclerotic heart disease of native coronary artery without angina pectoris: Secondary | ICD-10-CM

## 2021-06-09 DIAGNOSIS — I340 Nonrheumatic mitral (valve) insufficiency: Secondary | ICD-10-CM

## 2021-06-09 DIAGNOSIS — Z951 Presence of aortocoronary bypass graft: Secondary | ICD-10-CM
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
## 2021-06-09 NOTE — Patient Instructions (Signed)
Medication Instructions:  ?Your physician recommends that you continue on your current medications as directed. Please refer to the Current Medication list given to you today. ? ?*If you need a refill on your cardiac medications before your next appointment, please call your pharmacy* ? ? ?Lab Work: ?none ?If you have labs (blood work) drawn today and your tests are completely normal, you will receive your results only by: ?MyChart Message (if you have MyChart) OR ?A paper copy in the mail ?If you have any lab test that is abnormal or we need to change your treatment, we will call you to review the results. ? ? ?Testing/Procedures: ?Your physician has requested that you have an echocardiogram. Echocardiography is a painless test that uses sound waves to create images of your heart. It provides your doctor with information about the size and shape of your heart and how well your heart?s chambers and valves are working. This procedure takes approximately one hour. There are no restrictions for this procedure. ? ? ? ?Follow-Up: ?At [facility], you and your health needs are our priority.  As part of our continuing mission to provide you with exceptional heart care, we have created designated Provider Care Teams.  These Care Teams include your primary Cardiologist (physician) and Advanced Practice Providers (APPs -  Physician Assistants and Nurse Practitioners) who all work together to provide you with the care you need, when you need it. ? ?We recommend signing up for the patient portal called "MyChart".  Sign up information is provided on this After Visit Summary.  MyChart is used to connect with patients for Virtual Visits (Telemedicine).  Patients are able to view lab/test results, encounter notes, upcoming appointments, etc.  Non-urgent messages can be sent to your provider as well.   ?To learn more about what you can do with MyChart, go to [email or web address].   ? ?Your next appointment:   ?1 year(s) ? ?The  format for your next appointment:   ?In Person ? ?Provider:  Dr Laurance Flatten  ? ?If primary card or EP is not listed click here to update    :1}  ? ? ?Other Instructions ? ? ?Important Information About Sugar ? ? ? ? ?  ?

## 2021-06-27 ENCOUNTER — Ambulatory Visit (HOSPITAL_COMMUNITY): Payer: BC Managed Care – PPO | Attending: Cardiovascular Disease

## 2021-06-27 DIAGNOSIS — I2510 Atherosclerotic heart disease of native coronary artery without angina pectoris: Secondary | ICD-10-CM | POA: Diagnosis not present

## 2021-06-27 DIAGNOSIS — I77810 Thoracic aortic ectasia: Secondary | ICD-10-CM | POA: Diagnosis not present

## 2021-06-27 DIAGNOSIS — I2583 Coronary atherosclerosis due to lipid rich plaque: Secondary | ICD-10-CM | POA: Insufficient documentation

## 2021-06-27 LAB — ECHOCARDIOGRAM COMPLETE
Area-P 1/2: 2.48 cm2
S' Lateral: 3.9 cm

## 2021-09-01 ENCOUNTER — Emergency Department (HOSPITAL_BASED_OUTPATIENT_CLINIC_OR_DEPARTMENT_OTHER): Payer: BC Managed Care – PPO

## 2021-09-01 ENCOUNTER — Emergency Department (HOSPITAL_BASED_OUTPATIENT_CLINIC_OR_DEPARTMENT_OTHER)
Admission: EM | Admit: 2021-09-01 | Discharge: 2021-09-01 | Discharge status: Against Medical Advice | Payer: BC Managed Care – PPO | Attending: Emergency Medicine | Admitting: Emergency Medicine

## 2021-09-01 ENCOUNTER — Encounter (HOSPITAL_BASED_OUTPATIENT_CLINIC_OR_DEPARTMENT_OTHER): Payer: Self-pay

## 2021-09-01 DIAGNOSIS — Z5321 Procedure and treatment not carried out due to patient leaving prior to being seen by health care provider: Secondary | ICD-10-CM | POA: Insufficient documentation

## 2021-09-01 DIAGNOSIS — M79604 Pain in right leg: Secondary | ICD-10-CM | POA: Insufficient documentation

## 2021-09-01 DIAGNOSIS — M7989 Other specified soft tissue disorders: Secondary | ICD-10-CM | POA: Diagnosis not present

## 2021-09-01 NOTE — ED Triage Notes (Signed)
Pt presents to the ED with right leg pain. States that he was walking on Friday and it felt like his muscle "ripped". Reports continued pain since. Reports leg swelling and bruising. Denies use of blood thinner. Denies hx of blood clots.   Bruising noted to heel of right foot. Pt ambulatory to triage room. VSS. Pt A&Ox4 at time of triage. Strong pedal pulse

## 2021-09-03 DIAGNOSIS — S86111A Strain of other muscle(s) and tendon(s) of posterior muscle group at lower leg level, right leg, initial encounter: Secondary | ICD-10-CM | POA: Diagnosis not present

## 2021-10-31 DIAGNOSIS — M79671 Pain in right foot: Secondary | ICD-10-CM | POA: Diagnosis not present

## 2021-10-31 DIAGNOSIS — I10 Essential (primary) hypertension: Secondary | ICD-10-CM | POA: Diagnosis not present

## 2021-11-24 DIAGNOSIS — M25562 Pain in left knee: Secondary | ICD-10-CM | POA: Diagnosis not present

## 2022-04-15 NOTE — Telephone Encounter (Signed)
done

## 2022-05-20 DIAGNOSIS — Z1330 Encounter for screening examination for mental health and behavioral disorders, unspecified: Secondary | ICD-10-CM | POA: Diagnosis not present

## 2022-05-20 DIAGNOSIS — M109 Gout, unspecified: Secondary | ICD-10-CM | POA: Diagnosis not present

## 2022-05-20 DIAGNOSIS — I10 Essential (primary) hypertension: Secondary | ICD-10-CM | POA: Diagnosis not present

## 2022-05-20 DIAGNOSIS — Z13220 Encounter for screening for lipoid disorders: Secondary | ICD-10-CM | POA: Diagnosis not present

## 2022-05-20 DIAGNOSIS — Z1211 Encounter for screening for malignant neoplasm of colon: Secondary | ICD-10-CM | POA: Diagnosis not present

## 2022-05-20 DIAGNOSIS — F419 Anxiety disorder, unspecified: Secondary | ICD-10-CM | POA: Diagnosis not present

## 2022-05-20 DIAGNOSIS — Z0000 Encounter for general adult medical examination without abnormal findings: Secondary | ICD-10-CM | POA: Diagnosis not present

## 2022-05-20 DIAGNOSIS — K219 Gastro-esophageal reflux disease without esophagitis: Secondary | ICD-10-CM | POA: Diagnosis not present

## 2022-06-23 DIAGNOSIS — M25561 Pain in right knee: Secondary | ICD-10-CM | POA: Diagnosis not present

## 2022-07-21 DIAGNOSIS — M25561 Pain in right knee: Secondary | ICD-10-CM | POA: Diagnosis not present

## 2023-03-11 NOTE — Progress Notes (Signed)
 Cardiology Office Note:  .   Date:  03/12/2023  ID:  [name] [name], DOB 12-15-1965, MRN [id number] PCP: Teresa Aldona CROME, NP  [facility] Providers Cardiologist:  Leim Moose, MD {  History of Present Illness: .   [name] [name] is a 58 y.o. male with a past medical history of CAD status post SVG to RCA bypass, HTN and family history of premature CAD who is here for follow-up appointment.  Per review of record, patient was admitted 1/20 through 03/07/2014 and presented with unstable angina.  Ruled out for myocardial infarction by enzymes.  Cardiac catheterization demonstrated high-grade stenosis in the RCA.  Underwent attempted PCI of the RCA which was complicated by intimal dissection.  Referred for emergency bypass surgery.  Underwent CABG x 1 by Dr. Dusty with a SVG to RCA.  Postoperative course was notable for blood loss anemia requiring transfusion.  Remained in sinus rhythm.  Was noted to have done well postoperatively.  He reported fatigue likely setting of increased work hours a 60 hours a week.  Denied anginal symptoms but had complaints of palpitations that lasted several seconds with associated dizziness and no syncope.  He self stopped taking carvedilol  in setting of significant fatigue.  Given this was scheduled to wear a 48-hour Holter monitor which showed no significant sustained arrhythmias.  Recommended to continue current medication regimen.  Saw Kate Minus, NP 07/2026 where he was doing okay other than brief palpitations.  Underwent repeat serial 08/2018 which showed sinus bradycardia/sinus tachycardia, and rare ectopy, no sustained arrhythmias.  He was seen back in May 2023 and was feeling pretty good and no chest pain.  Main concern was low testosterone  which was found at his PCP.  Recommended follow-up with us  to discuss testosterone  supplements.  Occasionally does have palpitations that last for seconds at a time.  Episodes are more noticeable at night and he  describes them as a couple fast beats 1 long beat them back to normal.  He also affirms that he is able to walk 1 mile or climb a flight of stairs without any need stop and rest although when he does need to do more physical work he notices becoming fatigued more easily.  Denied any peripheral edema, lightheadedness, headaches, syncope, orthopnea, and PND.  Ultimately, recommended not starting testosterone  supplements as he was at high risk given premature CAD and strong family history of stroke and CAD.  Today, he presents with a history of coronary disease and bypass surgery with concerns about increasing fatigue and occasional leg swelling. The patient reports that the fatigue is not as severe as when he had his catheterization but is noticeable and affecting his daily activities. The patient also reports occasional palpitations, described as a feeling of a long pause followed by a hard beat, which he has experienced since his surgery. These episodes are not increasing in frequency or duration. The patient is not currently on any specific medication for blood pressure and reports a slightly elevated blood pressure reading during the visit. The patient has requested an echocardiogram and blood work to assess his current health status.  Discussed the use of AI scribe software for clinical note transcription with the patient, who gave verbal consent to proceed.  Reports no shortness of breath nor dyspnea on exertion. Reports no chest pain, pressure, or tightness. No edema, orthopnea, PND. Reports no palpitations.    ROS: pertinent ROS in HPI  Studies Reviewed: SABRA   EKG Interpretation Date/Time:  Friday March 12 2023 08:46:37 EST Ventricular Rate:  55 PR Interval:  186 QRS Duration:  104 QT Interval:  440 QTC Calculation: 420 R Axis:   -23  Text Interpretation: Sinus bradycardia Nonspecific T wave abnormality When compared with ECG of 08-Sep-2014 19:59, PREVIOUS ECG IS PRESENT Confirmed by  Lucien Blanc [phone]) on 03/12/2023 8:52:25 AM     Echo 06/27/21 IMPRESSIONS     1. Left ventricular ejection fraction, by estimation, is 55 to 60%. Left  ventricular ejection fraction by 3D volume is 55 %. The left ventricle has  normal function. The left ventricle has no regional wall motion  abnormalities. Left ventricular diastolic   parameters were normal.   2. Right ventricular systolic function is normal. The right ventricular  size is normal. There is normal pulmonary artery systolic pressure. The  estimated right ventricular systolic pressure is 23.4 mmHg.   3. The mitral valve is grossly normal. Trivial mitral valve  regurgitation. No evidence of mitral stenosis.   4. The aortic valve is tricuspid. Aortic valve regurgitation is not  visualized. No aortic stenosis is present.   5. Aortic dilatation noted. There is mild dilatation of the aortic root,  measuring 41 mm.   6. The inferior vena cava is normal in size with greater than 50%  respiratory variability, suggesting right atrial pressure of 3 mmHg.   Comparison(s): 09/14/14 EF 55-60%.   FINDINGS   Left Ventricle: Left ventricular ejection fraction, by estimation, is 55  to 60%. Left ventricular ejection fraction by 3D volume is 55 %. The left  ventricle has normal function. The left ventricle has no regional wall  motion abnormalities. The left  ventricular internal cavity size was normal in size. There is no left  ventricular hypertrophy. Left ventricular diastolic parameters were  normal.   Right Ventricle: The right ventricular size is normal. No increase in  right ventricular wall thickness. Right ventricular systolic function is  normal. There is normal pulmonary artery systolic pressure. The tricuspid  regurgitant velocity is 2.26 m/s, and   with an assumed right atrial pressure of 3 mmHg, the estimated right  ventricular systolic pressure is 23.4 mmHg.   Left Atrium: Left atrial size was normal in size.    Right Atrium: Right atrial size was normal in size.   Pericardium: There is no evidence of pericardial effusion.   Mitral Valve: The mitral valve is grossly normal. Trivial mitral valve  regurgitation. No evidence of mitral valve stenosis.   Tricuspid Valve: The tricuspid valve is grossly normal. Tricuspid valve  regurgitation is mild . No evidence of tricuspid stenosis.   Aortic Valve: The aortic valve is tricuspid. Aortic valve regurgitation is  not visualized. No aortic stenosis is present.   Pulmonic Valve: The pulmonic valve was grossly normal. Pulmonic valve  regurgitation is trivial. No evidence of pulmonic stenosis.   Aorta: Aortic dilatation noted. There is mild dilatation of the aortic  root, measuring 41 mm.   Venous: The right lower pulmonary vein is normal. The inferior vena cava  is normal in size with greater than 50% respiratory variability,  suggesting right atrial pressure of 3 mmHg.   IAS/Shunts: The atrial septum is grossly normal.       Physical Exam:   VS:  BP 138/84 (BP Location: Right Arm, Patient Position: Sitting, Cuff Size: Large)   Pulse 67   Resp 16   Ht 6' 6 (1.981 m)   Wt 298 lb (135.2 kg)   SpO2 97%   BMI  34.44 kg/m    Wt Readings from Last 3 Encounters:  03/12/23 298 lb (135.2 kg)  09/01/21 264 lb (119.7 kg)  06/09/21 291 lb (132 kg)    GEN: Well nourished, well developed in no acute distress NECK: No JVD; No carotid bruits CARDIAC: RRR, no murmurs, rubs, gallops RESPIRATORY:  Clear to auscultation without rales, wheezing or rhonchi  ABDOMEN: Soft, non-tender, non-distended EXTREMITIES:  No edema; No deformity   ASSESSMENT AND PLAN: .   Coronary Artery Disease s/p CABG x 2 History of bypass surgery. Patient reports increased fatigue, but not as severe as when he had his catheterization. No new or worsening chest pain. Mild leg swelling noted, but it resolves. Blood pressure slightly elevated in the office. -Order echocardiogram  to assess cardiac structure and function. -Advise patient to monitor blood pressure at home daily for 1-2 weeks and report values. -Consider compression socks for leg swelling.  Hyperlipidemia No recent lipid panel. -Order fasting lipid panel and liver function tests today.   Palpitations -These have been stable.  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

## 2023-03-12 ENCOUNTER — Encounter: Payer: Self-pay | Admitting: Physician Assistant

## 2023-03-12 ENCOUNTER — Ambulatory Visit: Payer: BC Managed Care – PPO | Attending: Physician Assistant | Admitting: Physician Assistant

## 2023-03-12 VITALS — BP 138/84 | HR 67 | Resp 16 | Ht 78.0 in | Wt 298.0 lb

## 2023-03-12 DIAGNOSIS — I2583 Coronary atherosclerosis due to lipid rich plaque: Secondary | ICD-10-CM

## 2023-03-12 DIAGNOSIS — R0609 Other forms of dyspnea: Secondary | ICD-10-CM

## 2023-03-12 DIAGNOSIS — I2510 Atherosclerotic heart disease of native coronary artery without angina pectoris: Secondary | ICD-10-CM | POA: Diagnosis not present

## 2023-03-12 DIAGNOSIS — I77810 Thoracic aortic ectasia: Secondary | ICD-10-CM | POA: Diagnosis not present

## 2023-03-12 DIAGNOSIS — R002 Palpitations: Secondary | ICD-10-CM

## 2023-03-12 DIAGNOSIS — Z951 Presence of aortocoronary bypass graft: Secondary | ICD-10-CM

## 2023-03-12 DIAGNOSIS — I340 Nonrheumatic mitral (valve) insufficiency: Secondary | ICD-10-CM

## 2023-03-12 NOTE — Patient Instructions (Addendum)
 Medication Instructions:  Your physician recommends that you continue on your current medications as directed. Please refer to the Current Medication list given to you today.  *If you need a refill on your cardiac medications before your next appointment, please call your pharmacy*  Lab Work: Lipid and LFTs--Today  If you have labs (blood work) drawn today and your tests are completely normal, you will receive your results only by: MyChart Message (if you have MyChart) OR A paper copy in the mail If you have any lab test that is abnormal or we need to change your treatment, we will call you to review the results.  Testing/Procedures: Your physician has requested that you have an echocardiogram. Echocardiography is a painless test that uses sound waves to create images of your heart. It provides your doctor with information about the size and shape of your heart and how well your heart's chambers and valves are working. This procedure takes approximately one hour. There are no restrictions for this procedure. Please do NOT wear cologne, perfume, aftershave, or lotions (deodorant is allowed). Please arrive 15 minutes prior to your appointment time.  Please note: We ask at that you not bring children with you during ultrasound (echo/ vascular) testing. Due to room size and safety concerns, children are not allowed in the ultrasound rooms during exams. Our front office staff cannot provide observation of children in our lobby area while testing is being conducted. An adult accompanying a patient to their appointment will only be allowed in the ultrasound room at the discretion of the ultrasound technician under special circumstances. We apologize for any inconvenience.    Follow-Up: At [facility], you and your health needs are our priority.  As part of our continuing mission to provide you with exceptional heart care, we have created designated Provider Care Teams.  These Care Teams include  your primary Cardiologist (physician) and Advanced Practice Providers (APPs -  Physician Assistants and Nurse Practitioners) who all work together to provide you with the care you need, when you need it.   Your next appointment:   1 year(s)  The format for your next appointment:   In Person  Provider:   Orren Fabry, PA-C  Important Information About Sugar

## 2023-03-18 DIAGNOSIS — M79672 Pain in left foot: Secondary | ICD-10-CM | POA: Diagnosis not present

## 2023-03-18 DIAGNOSIS — M109 Gout, unspecified: Secondary | ICD-10-CM | POA: Diagnosis not present

## 2023-03-18 DIAGNOSIS — I10 Essential (primary) hypertension: Secondary | ICD-10-CM | POA: Diagnosis not present

## 2023-03-18 DIAGNOSIS — F419 Anxiety disorder, unspecified: Secondary | ICD-10-CM | POA: Diagnosis not present

## 2023-03-18 DIAGNOSIS — M79671 Pain in right foot: Secondary | ICD-10-CM | POA: Diagnosis not present

## 2023-03-18 DIAGNOSIS — Z0000 Encounter for general adult medical examination without abnormal findings: Secondary | ICD-10-CM | POA: Diagnosis not present

## 2023-03-18 DIAGNOSIS — Z125 Encounter for screening for malignant neoplasm of prostate: Secondary | ICD-10-CM | POA: Diagnosis not present

## 2023-03-18 DIAGNOSIS — E785 Hyperlipidemia, unspecified: Secondary | ICD-10-CM | POA: Diagnosis not present

## 2023-03-18 DIAGNOSIS — Z1330 Encounter for screening examination for mental health and behavioral disorders, unspecified: Secondary | ICD-10-CM | POA: Diagnosis not present

## 2023-03-30 ENCOUNTER — Ambulatory Visit (HOSPITAL_COMMUNITY): Payer: BC Managed Care – PPO | Attending: Physician Assistant

## 2023-03-30 DIAGNOSIS — R0609 Other forms of dyspnea: Secondary | ICD-10-CM | POA: Diagnosis not present

## 2023-03-30 LAB — ECHOCARDIOGRAM COMPLETE
Area-P 1/2: 2.29 cm2
S' Lateral: 3.2 cm

## 2023-03-31 ENCOUNTER — Encounter: Payer: Self-pay | Admitting: Physician Assistant
